# Patient Record
Sex: Female | Born: 1985 | Race: White | Hispanic: No | Marital: Married | Smoking: Current every day smoker
Health system: Southern US, Community
[De-identification: ages and names within clinical notes are randomized; demographics above are authoritative.]

## PROBLEM LIST (undated history)

## (undated) DIAGNOSIS — G950 Syringomyelia and syringobulbia: Secondary | ICD-10-CM

## (undated) DIAGNOSIS — B009 Herpesviral infection, unspecified: Secondary | ICD-10-CM

## (undated) DIAGNOSIS — Z8619 Personal history of other infectious and parasitic diseases: Secondary | ICD-10-CM

## (undated) DIAGNOSIS — R569 Unspecified convulsions: Secondary | ICD-10-CM

## (undated) HISTORY — DX: Herpesviral infection, unspecified: B00.9

## (undated) HISTORY — PX: WISDOM TOOTH EXTRACTION: SHX21

## (undated) HISTORY — DX: Personal history of other infectious and parasitic diseases: Z86.19

---
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
## 2015-12-30 DIAGNOSIS — G8929 Other chronic pain: Secondary | ICD-10-CM | POA: Insufficient documentation

## 2017-05-29 ENCOUNTER — Emergency Department
Admission: EM | Admit: 2017-05-29 | Discharge: 2017-05-30 | Disposition: A | Discharge status: Home / Self Care | Payer: 59 | Attending: Emergency Medicine | Admitting: Emergency Medicine

## 2017-05-29 ENCOUNTER — Encounter: Payer: Self-pay | Admitting: Emergency Medicine

## 2017-05-29 DIAGNOSIS — A0472 Enterocolitis due to Clostridium difficile, not specified as recurrent: Secondary | ICD-10-CM | POA: Diagnosis not present

## 2017-05-29 DIAGNOSIS — F17200 Nicotine dependence, unspecified, uncomplicated: Secondary | ICD-10-CM | POA: Insufficient documentation

## 2017-05-29 DIAGNOSIS — R197 Diarrhea, unspecified: Secondary | ICD-10-CM | POA: Diagnosis present

## 2017-05-29 HISTORY — DX: Unspecified convulsions: R56.9

## 2017-05-29 LAB — URINALYSIS, COMPLETE (UACMP) WITH MICROSCOPIC
Bilirubin Urine: NEGATIVE
GLUCOSE, UA: NEGATIVE mg/dL
Ketones, ur: 80 mg/dL — AB
Leukocytes, UA: NEGATIVE
Nitrite: NEGATIVE
PH: 5 (ref 5.0–8.0)
Protein, ur: NEGATIVE mg/dL
SPECIFIC GRAVITY, URINE: 1.011 (ref 1.005–1.030)
SQUAMOUS EPITHELIAL / LPF: NONE SEEN

## 2017-05-29 LAB — COMPREHENSIVE METABOLIC PANEL
ALK PHOS: 45 U/L (ref 38–126)
ALT: 13 U/L — AB (ref 14–54)
AST: 15 U/L (ref 15–41)
Albumin: 4.1 g/dL (ref 3.5–5.0)
Anion gap: 11 (ref 5–15)
BILIRUBIN TOTAL: 1.1 mg/dL (ref 0.3–1.2)
CALCIUM: 8.7 mg/dL — AB (ref 8.9–10.3)
CHLORIDE: 107 mmol/L (ref 101–111)
CO2: 18 mmol/L — ABNORMAL LOW (ref 22–32)
CREATININE: 0.73 mg/dL (ref 0.44–1.00)
GFR calc Af Amer: >60 mL/min (ref >60)
Glucose, Bld: 72 mg/dL (ref 65–99)
Potassium: 3.3 mmol/L — ABNORMAL LOW (ref 3.5–5.1)
Sodium: 136 mmol/L (ref 135–145)
Total Protein: 6.9 g/dL (ref 6.5–8.1)

## 2017-05-29 LAB — LIPASE, BLOOD: LIPASE: 29 U/L (ref 11–51)

## 2017-05-29 LAB — CBC
HCT: 43 % (ref 35.0–47.0)
Hemoglobin: 14.7 g/dL (ref 12.0–16.0)
MCH: 29.1 pg (ref 26.0–34.0)
MCHC: 34.1 g/dL (ref 32.0–36.0)
MCV: 85.3 fL (ref 80.0–100.0)
PLATELETS: 167 10*3/uL (ref 150–440)
RBC: 5.04 MIL/uL (ref 3.80–5.20)
RDW: 13.3 % (ref 11.5–14.5)
WBC: 7.5 10*3/uL (ref 3.6–11.0)

## 2017-05-29 LAB — POCT PREGNANCY, URINE: Preg Test, Ur: NEGATIVE

## 2017-05-29 MED ORDER — SODIUM CHLORIDE 0.9 % IV BOLUS (SEPSIS)
1000.0000 mL | Freq: Once | INTRAVENOUS | Status: AC
  Administered 2017-05-29: 1000 mL via INTRAVENOUS

## 2017-05-29 NOTE — ED Notes (Signed)
Pt reports diarrhea with cramps for 1 week, pt had ABX last week for wisdom teeth extraction,

## 2017-05-29 NOTE — ED Notes (Signed)
Family at bedside. 

## 2017-05-29 NOTE — ED Notes (Signed)
ED Provider at bedside. 

## 2017-05-29 NOTE — ED Provider Notes (Signed)
[facility] Emergency Department Provider Note  ____________________________________________   I have reviewed the triage vital signs and the nursing notes.   HISTORY  Chief Complaint Abdominal Pain; Emesis; and Diarrhea    HPI Hailey Pennington is a 31 y.o. female who presents today complaining of diarrhea. Patient's had diarrhea for 7 or 8 days. It happened while she was on a trip to [place]. Unfortunate, about a week for the patient was placed on vancomycin and amoxicillin for a dental infection. The dental infections completely resolved but the diarrhea has not been persistent for 1 week. She went to primary care, they did elect to order Cipro and Imodium for her however she elected not to take the Cipro she thought that might make the diarrhea worse if it was C. difficile. Patient has no history of C. difficile. She has had no melena or bright blood per rectum she's had occasional cramping but no significant abdominal pain sheet a fever week ago but none since, she is tolerating by mouth without difficulty and has not been vomiting. The diarrhea is watery and persistent she's had 5 episodes today. Nothing makes it better and nothing makes it worse, see above for associated symptoms and prior treatment    Past Medical History:  Diagnosis Date  . Seizures (HCC)     There are no active problems to display for this patient.   History reviewed. No pertinent surgical history.  Prior to Admission medications   Not on File    Allergies Patient has no known allergies.  No family history on file.  Social History Social History  Substance Use Topics  . Smoking status: Current Every Day Smoker  . Smokeless tobacco: Never Used  . Alcohol use Yes    Review of Systems Constitutional: No fever/chills Eyes: No visual changes. ENT: No sore throat. No stiff neck no neck pain Cardiovascular: Denies chest pain. Respiratory: Denies shortness of  breath. Gastrointestinal:   no vomiting.  positivediarrhea.  No constipation. Genitourinary: Negative for dysuria. Musculoskeletal: Negative lower extremity swelling Skin: Negative for rash. Neurological: Negative for severe headaches, focal weakness or numbness.   ____________________________________________   PHYSICAL EXAM:  VITAL SIGNS: ED Triage Vitals  Enc Vitals Group     BP 05/29/17 1631 127/73     Pulse Rate 05/29/17 1631 98     Resp 05/29/17 1631 16     Temp 05/29/17 1631 98.5 F (36.9 C)     Temp Source 05/29/17 1631 Oral     SpO2 05/29/17 1631 99 %     Weight 05/29/17 1631 124 lb (56.2 kg)     Height 05/29/17 1631  (1.549 m)     Head Circumference --      Peak Flow --      Pain Score 05/29/17 1629 3     Pain Loc --      Pain Edu? --      Excl. in GC? --     Constitutional: Alert and oriented. Well appearing and in no acute distress. Eyes: Conjunctivae are normal Head: Atraumatic HEENT: No congestion/rhinnorhea. Mucous membranes are somewhat dry.  Oropharynx non-erythematous Neck:   Nontender with no meningismus, no masses, no stridor Cardiovascular: Normal rate, regular rhythm. Grossly normal heart sounds.  Good peripheral circulation. Respiratory: Normal respiratory effort.  No retractions. Lungs CTAB. Abdominal: Soft and nontender. No distention. No guarding no rebound Back:  There is no focal tenderness or step off.  there is no midline tenderness there are no  lesions noted. there is no CVA tenderness Musculoskeletal: No lower extremity tenderness, no upper extremity tenderness. No joint effusions, no DVT signs strong distal pulses no edema Neurologic:  Normal speech and language. No gross focal neurologic deficits are appreciated.  Skin:  Skin is warm, dry and intact. No rash noted. Psychiatric: Mood and affect are normal. Speech and behavior are normal.  ____________________________________________   LABS (all labs ordered are listed, but only  abnormal results are displayed)  Labs Reviewed  COMPREHENSIVE METABOLIC PANEL - Abnormal; Notable for the following:       Result Value   Potassium 3.3 (*)    CO2 18 (*)    BUN <5 (*)    Calcium 8.7 (*)    ALT 13 (*)    All other components within normal limits  URINALYSIS, COMPLETE (UACMP) WITH MICROSCOPIC - Abnormal; Notable for the following:    Color, Urine YELLOW (*)    APPearance CLEAR (*)    Hgb urine dipstick LARGE (*)    Ketones, ur 80 (*)    Bacteria, UA RARE (*)    All other components within normal limits  GASTROINTESTINAL PANEL BY PCR, STOOL (REPLACES STOOL CULTURE)  C DIFFICILE QUICK SCREEN W PCR REFLEX  LIPASE, BLOOD  CBC  POCT PREGNANCY, URINE    Pertinent labs  results that were available during my care of the patient were reviewed by me and considered in my medical decision making (see chart for details). ____________________________________________  EKG  I personally interpreted any EKGs ordered by me or triage  ____________________________________________  RADIOLOGY  Pertinent labs & imaging results that were available during my care of the patient were reviewed by me and considered in my medical decision making (see chart for details). If possible, patient and/or family made aware of any abnormal findings. ____________________________________________    PROCEDURES  Procedure(s) performed: None  Procedures  Critical Care performed: None  ____________________________________________   INITIAL IMPRESSION / ASSESSMENT AND PLAN / ED COURSE  Pertinent labs & imaging results that were available during my care of the patient were reviewed by me and considered in my medical decision making (see chart for details).  patient here with diarrhea, 5 episodes today however has not had a diarrheal stool since she arrived. She's been on Imodium for h1 day. Recent significant antibiotic use increases the risk factor for eitherantibiotic associated diarrhea  orClostridium difficile we will send stool bio fire and C. difficile we will give her IV fluid her abdomen is benign, she is obviously somewhat dehydrated.    ____________________________________________   FINAL CLINICAL IMPRESSION(S) / ED DIAGNOSES  Final diagnoses:  None      This chart was dictated using voice recognition software.  Despite best efforts to proofread,  errors can occur which can change meaning.      Jeanmarie Plant, MD 05/29/17 2144

## 2017-05-29 NOTE — Discharge Instructions (Addendum)
PLease follow up with your primary care physician or the acute care clinic °

## 2017-05-29 NOTE — ED Triage Notes (Signed)
Pt to ED via POV stating that she has had abdominal pain, N/V/D since Sunday. Pt recently finished 2 courses of antibiotics. Pt was on Amoxicillin and the Vancomycin for 1 week after having a tooth pulled. Pt has been taking antidiarrhea medication without relief. Pt states that she is unable to keep fluids down. Pt states that she has seen blood in stool a few times.   Pts heart rate increases from 98 to 116 from sitting to standing.

## 2017-05-30 LAB — GASTROINTESTINAL PANEL BY PCR, STOOL (REPLACES STOOL CULTURE)
ADENOVIRUS F40/41: NOT DETECTED
Astrovirus: NOT DETECTED
CRYPTOSPORIDIUM: NOT DETECTED
CYCLOSPORA CAYETANENSIS: NOT DETECTED
Campylobacter species: NOT DETECTED
ENTEROAGGREGATIVE E COLI (EAEC): NOT DETECTED
ENTEROPATHOGENIC E COLI (EPEC): NOT DETECTED
Entamoeba histolytica: NOT DETECTED
Enterotoxigenic E coli (ETEC): NOT DETECTED
GIARDIA LAMBLIA: NOT DETECTED
Norovirus GI/GII: NOT DETECTED
PLESIMONAS SHIGELLOIDES: NOT DETECTED
Rotavirus A: NOT DETECTED
SALMONELLA SPECIES: NOT DETECTED
SHIGELLA/ENTEROINVASIVE E COLI (EIEC): NOT DETECTED
Sapovirus (I, II, IV, and V): NOT DETECTED
Shiga like toxin producing E coli (STEC): NOT DETECTED
VIBRIO SPECIES: NOT DETECTED
Vibrio cholerae: NOT DETECTED
YERSINIA ENTEROCOLITICA: NOT DETECTED

## 2017-05-30 LAB — C DIFFICILE QUICK SCREEN W PCR REFLEX
C Diff antigen: POSITIVE — AB
C Diff interpretation: DETECTED
C Diff toxin: POSITIVE — AB

## 2017-05-30 MED ORDER — METRONIDAZOLE 500 MG PO TABS: 500.0000 mg | ORAL_TABLET | Freq: Once | ORAL | Status: DC

## 2017-05-30 MED ORDER — METRONIDAZOLE 500 MG PO TABS: 500.0000 mg | ORAL_TABLET | Freq: Three times a day (TID) | ORAL | 0 refills | Status: AC

## 2017-05-30 MED ORDER — VANCOMYCIN 50 MG/ML ORAL SOLUTION
125.0000 mg | ORAL | Status: AC
  Administered 2017-05-30: 125 mg via ORAL
  Filled 2017-05-30: qty 2.5

## 2017-05-30 NOTE — ED Provider Notes (Signed)
-----------------------------------------   1:08 AM on 05/30/2017 -----------------------------------------   Blood pressure 101/71, pulse 83, temperature 98.5 F (36.9 C), temperature source Oral, resp. rate 18, height  (1.549 m), weight 56.2 kg (124 lb), last menstrual period 05/29/2017, SpO2 98 %.  Assuming care from Dr. Alphonzo Lemmings.  In short, Hailey Pennington is a 31 y.o. female with a chief complaint of Abdominal Pain; Emesis; and Diarrhea .  Refer to the original H&P for additional details.  The current plan of care is to follow up the results of the cdiff toxin.   The patient's C. difficile returned positive. I initially ordered the patient a dose of vancomycin orally. The patient's white blood cell count is normal and her creatinine is also normal. She did receive some fluids. I did have a conversation with the patient that I feel it is appropriate to discharge her to home. She is not vomiting and able to take by mouth. Since the patient had been taking vancomycin prior to this development of C. difficile I will discharge the patient on metronidazole. The patient will be discharged to follow-up with her primary care physician.       Rebecka Apley, MD 05/30/17 Lyda Jester

## 2018-06-03 DIAGNOSIS — M51369 Other intervertebral disc degeneration, lumbar region without mention of lumbar back pain or lower extremity pain: Secondary | ICD-10-CM | POA: Insufficient documentation

## 2018-06-03 DIAGNOSIS — M5416 Radiculopathy, lumbar region: Secondary | ICD-10-CM | POA: Insufficient documentation

## 2018-09-23 DIAGNOSIS — G950 Syringomyelia and syringobulbia: Secondary | ICD-10-CM | POA: Insufficient documentation

## 2019-01-02 DIAGNOSIS — D352 Benign neoplasm of pituitary gland: Secondary | ICD-10-CM | POA: Insufficient documentation

## 2019-01-30 ENCOUNTER — Ambulatory Visit: Payer: Managed Care, Other (non HMO) | Admitting: Adult Health

## 2019-04-12 DIAGNOSIS — G47411 Narcolepsy with cataplexy: Secondary | ICD-10-CM | POA: Insufficient documentation

## 2019-05-14 ENCOUNTER — Emergency Department
Admission: EM | Admit: 2019-05-14 | Discharge: 2019-05-14 | Disposition: A | Discharge status: Home / Self Care | Payer: Managed Care, Other (non HMO) | Attending: Emergency Medicine | Admitting: Emergency Medicine

## 2019-05-14 ENCOUNTER — Other Ambulatory Visit: Payer: Self-pay

## 2019-05-14 ENCOUNTER — Encounter: Payer: Self-pay | Admitting: Emergency Medicine

## 2019-05-14 DIAGNOSIS — H5711 Ocular pain, right eye: Secondary | ICD-10-CM | POA: Diagnosis present

## 2019-05-14 DIAGNOSIS — H1031 Unspecified acute conjunctivitis, right eye: Secondary | ICD-10-CM

## 2019-05-14 DIAGNOSIS — H10021 Other mucopurulent conjunctivitis, right eye: Secondary | ICD-10-CM | POA: Diagnosis not present

## 2019-05-14 DIAGNOSIS — F17210 Nicotine dependence, cigarettes, uncomplicated: Secondary | ICD-10-CM | POA: Insufficient documentation

## 2019-05-14 DIAGNOSIS — Z79899 Other long term (current) drug therapy: Secondary | ICD-10-CM | POA: Diagnosis not present

## 2019-05-14 HISTORY — DX: Syringomyelia and syringobulbia: G95.0

## 2019-05-14 MED ORDER — CIPROFLOXACIN HCL 0.3 % OP SOLN: 2.0000 [drp] | OPHTHALMIC | 0 refills | Status: AC

## 2019-05-14 MED ORDER — CIPROFLOXACIN HCL 0.3 % OP SOLN
2.0000 [drp] | OPHTHALMIC | Status: DC
  Administered 2019-05-14: 14:00:00 2 [drp] via OPHTHALMIC
  Filled 2019-05-14: qty 2.5

## 2019-05-14 NOTE — ED Notes (Signed)
Provider at bedside

## 2019-05-14 NOTE — ED Triage Notes (Signed)
Pt to ED via POV c/o right eye pain and redness x 1 day. Pt has photosensitivity. Pt is in NAD.

## 2019-05-14 NOTE — Discharge Instructions (Signed)
Please avoid wearing contacts for 1 week.  Use antibiotic eyedrops as prescribed, 2 drops to the right eye every 3 hours while awake for 7 days.

## 2019-05-14 NOTE — ED Notes (Signed)
First Nurse Note: Pt to ED for right eye pain and redness x 1 day

## 2019-05-14 NOTE — ED Provider Notes (Signed)
[facility] EMERGENCY DEPARTMENT Provider Note   CSN: [id number] Arrival date & time: 05/14/19  1337     History   Chief Complaint Chief Complaint  Patient presents with  . Eye Pain    HPI Marshawn Ninneman is a 33 y.o. female.  Presents emergency department evaluation of right eye pain and redness x1 day.  Pain is 1 out of 10.  She describes mild photosensitivity that began this morning.  Patient states she wears contacts, yesterday developed some irritation redness and drainage last night.  This morning noted more drainage with matting of the eyelids.  She denies any left eye pain.  No vision changes.  No fevers.  No trauma to the eye and no foreign body sensation.     HPI  Past Medical History:  Diagnosis Date  . Seizures ([place])   . 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   History reviewed. No pertinent surgical history.   OB History   No obstetric history on file.      Home Medications    Prior to Admission medications   Medication Sig Start Date End Date Taking? Authorizing Provider  ciprofloxacin (CILOXAN) 0.3 % ophthalmic solution Place 2 drops into both eyes every 3 (three) hours while awake for 7 days. Administer 1 drop, every 2 hours, while awake, for 2 days. Then 1 drop, every 4 hours, while awake, for the next 5 days. 05/14/19 05/21/19  Duanne Guess, PA-C  ciprofloxacin (CIPRO) 250 MG tablet  05/28/17   [provider]  diphenoxylate-atropine (LOMOTIL) 2.5-0.025 MG tablet Take 1 tablet by mouth 4 (four) times daily as needed.  05/28/17   [provider]  folic acid (FOLVITE) 1 MG tablet Take 2 tablets by mouth twice daily 05/18/16   [provider]  gabapentin (NEURONTIN) 300 MG capsule TK ONE C PO TID 09/27/18   [provider]  lamoTRIgine (LAMICTAL) 100 MG tablet Take by mouth.    [provider]  LamoTRIgine 300 MG TB24 Take 1 tablet by mouth  daily. 03/23/17   [provider]  medroxyPROGESTERone Acetate 150 MG/ML SUSY USE UTD 09/04/15   [provider]  methocarbamol (ROBAXIN) 500 MG tablet Take by mouth. 08/31/16   [provider]  Multiple Vitamin (MULTIVITAMIN) capsule Take by mouth.    [provider]  ondansetron (ZOFRAN-ODT) 4 MG disintegrating tablet Take 4 mg by mouth every 8 (eight) hours as needed.  05/28/17   [provider]    Family History No family history on file.  Social History Social History   Tobacco Use  . Smoking status: Current Every Day Smoker  . Smokeless tobacco: Never Used  Substance Use Topics  . Alcohol use: Yes  . Drug use: No     Allergies   Patient has no known allergies.   Review of Systems Review of Systems  Constitutional: Negative for fever.  Eyes: Positive for photophobia, pain (1/10), discharge and redness. Negative for itching and visual disturbance.  Skin: Negative for rash.  Neurological: Negative for headaches.     Physical Exam Updated Vital Signs Ht 5\' 1"  (1.549 m)   Wt 49 kg   BMI 20.41 kg/m   Physical Exam Constitutional:      Appearance: She is well-developed.  HENT:     Head: Normocephalic and atraumatic.  Eyes:     General: Lids are normal. Vision grossly intact. Gaze aligned appropriately.  Right eye: Discharge present.     Extraocular Movements: Extraocular movements intact.     Right eye: Normal extraocular motion.     Conjunctiva/sclera:     Right eye: Right conjunctiva is injected. No exudate or hemorrhage.    Pupils: Pupils are equal, round, and reactive to light.  Neck:     Musculoskeletal: Normal range of motion.  Cardiovascular:     Rate and Rhythm: Normal rate.  Pulmonary:     Effort: Pulmonary effort is normal. No respiratory distress.  Musculoskeletal: Normal range of motion.  Skin:    General: Skin is warm.     Findings: No rash.  Neurological:     Mental Status: She is alert and  oriented to person, place, and time.  Psychiatric:        Behavior: Behavior normal.        Thought Content: Thought content normal.      ED Treatments / Results  Labs (all labs ordered are listed, but only abnormal results are displayed) Labs Reviewed - No data to display  EKG None  Radiology No results found.  Procedures Procedures (including critical care time)  Medications Ordered in ED Medications  ciprofloxacin (CILOXAN) 0.3 % ophthalmic solution 2 drop (2 drops Right Eye Given 05/14/19 1419)     Initial Impression / Assessment and Plan / ED Course  I have reviewed the triage vital signs and the nursing notes.  Pertinent labs & imaging results that were available during my care of the patient were reviewed by me and considered in my medical decision making (see chart for details).        33 year old female with conjunctivitis.  She is a contact lens wear.  She is started on Cipro ophthalmic eyedrops.  She will avoid contacts for 7 days.  She will follow-up with ophthalmology if no improvement in the next 2 to 3 days.  She return to the ER for any loss of vision, increasing pain fevers worsening symptoms or urgent changes in health.  Final Clinical Impressions(s) / ED Diagnoses   Final diagnoses:  Acute bacterial conjunctivitis of right eye    ED Discharge Orders         Ordered    ciprofloxacin (CILOXAN) 0.3 % ophthalmic solution  Every  3 hours while awake     05/14/19 1429           Ronnette Juniper 05/14/19 1434    Concha Se, MD 05/15/19 [phone]

## 2019-08-11 DIAGNOSIS — B009 Herpesviral infection, unspecified: Secondary | ICD-10-CM | POA: Insufficient documentation

## 2019-09-20 DIAGNOSIS — N879 Dysplasia of cervix uteri, unspecified: Secondary | ICD-10-CM | POA: Insufficient documentation

## 2019-11-15 DIAGNOSIS — F17210 Nicotine dependence, cigarettes, uncomplicated: Secondary | ICD-10-CM | POA: Insufficient documentation

## 2020-01-06 DIAGNOSIS — Z6791 Unspecified blood type, Rh negative: Secondary | ICD-10-CM | POA: Insufficient documentation

## 2020-03-22 ENCOUNTER — Emergency Department: Payer: Managed Care, Other (non HMO)

## 2020-03-22 ENCOUNTER — Other Ambulatory Visit: Payer: Self-pay

## 2020-03-22 ENCOUNTER — Emergency Department
Admission: EM | Admit: 2020-03-22 | Discharge: 2020-03-23 | Disposition: A | Discharge status: Home / Self Care | Payer: Managed Care, Other (non HMO) | Attending: Emergency Medicine | Admitting: Emergency Medicine

## 2020-03-22 DIAGNOSIS — R0602 Shortness of breath: Secondary | ICD-10-CM | POA: Diagnosis not present

## 2020-03-22 DIAGNOSIS — R05 Cough: Secondary | ICD-10-CM | POA: Insufficient documentation

## 2020-03-22 DIAGNOSIS — Z5321 Procedure and treatment not carried out due to patient leaving prior to being seen by health care provider: Secondary | ICD-10-CM | POA: Diagnosis not present

## 2020-03-22 DIAGNOSIS — R079 Chest pain, unspecified: Secondary | ICD-10-CM | POA: Insufficient documentation

## 2020-03-22 LAB — CBC
HCT: 28.3 % — ABNORMAL LOW (ref 36.0–46.0)
Hemoglobin: 9.3 g/dL — ABNORMAL LOW (ref 12.0–15.0)
MCH: 29.6 pg (ref 26.0–34.0)
MCHC: 32.9 g/dL (ref 30.0–36.0)
MCV: 90.1 fL (ref 80.0–100.0)
Platelets: 253 10*3/uL (ref 150–400)
RBC: 3.14 MIL/uL — ABNORMAL LOW (ref 3.87–5.11)
RDW: 14.3 % (ref 11.5–15.5)
WBC: 8.5 10*3/uL (ref 4.0–10.5)
nRBC: 0 % (ref 0.0–0.2)

## 2020-03-22 MED ORDER — SODIUM CHLORIDE 0.9% FLUSH: 3.0000 mL | Freq: Once | INTRAVENOUS | Status: DC

## 2020-03-22 NOTE — ED Triage Notes (Signed)
PT to ED via POV from home. Had c-section Aug. 3, and has been having SOB and CP since. PT describes it as if someone put suction down your throat and took your breath away, generalized sharp CP. PT does smoke. Occasional cough.

## 2020-03-23 ENCOUNTER — Emergency Department: Payer: Managed Care, Other (non HMO)

## 2020-03-23 ENCOUNTER — Encounter: Payer: Self-pay | Admitting: Radiology

## 2020-03-23 LAB — BASIC METABOLIC PANEL
Anion gap: 11 (ref 5–15)
BUN: 23 mg/dL — ABNORMAL HIGH (ref 6–20)
CO2: 24 mmol/L (ref 22–32)
Calcium: 8.9 mg/dL (ref 8.9–10.3)
Chloride: 106 mmol/L (ref 98–111)
Creatinine, Ser: 0.8 mg/dL (ref 0.44–1.00)
GFR calc Af Amer: >60 mL/min (ref >60)
GFR calc non Af Amer: >60 mL/min (ref >60)
Glucose, Bld: 100 mg/dL — ABNORMAL HIGH (ref 70–99)
Potassium: 3.8 mmol/L (ref 3.5–5.1)
Sodium: 141 mmol/L (ref 135–145)

## 2020-03-23 LAB — TROPONIN I (HIGH SENSITIVITY): Troponin I (High Sensitivity): 10 ng/L (ref <18)

## 2020-03-23 MED ORDER — SODIUM CHLORIDE 0.9 % IV BOLUS: 1000.0000 mL | Freq: Once | INTRAVENOUS | Status: DC

## 2020-03-23 MED ORDER — IOHEXOL 350 MG/ML SOLN
75.0000 mL | Freq: Once | INTRAVENOUS | Status: AC | PRN
  Administered 2020-03-23: 75 mL via INTRAVENOUS

## 2020-06-05 ENCOUNTER — Encounter: Payer: Self-pay | Admitting: Obstetrics and Gynecology

## 2020-06-05 ENCOUNTER — Other Ambulatory Visit: Payer: Self-pay

## 2020-06-05 ENCOUNTER — Ambulatory Visit (INDEPENDENT_AMBULATORY_CARE_PROVIDER_SITE_OTHER): Payer: Managed Care, Other (non HMO) | Admitting: Obstetrics and Gynecology

## 2020-06-05 VITALS — BP 120/70 | Ht 61.0 in | Wt 127.0 lb

## 2020-06-05 DIAGNOSIS — Z3042 Encounter for surveillance of injectable contraceptive: Secondary | ICD-10-CM

## 2020-06-05 MED ORDER — MEDROXYPROGESTERONE ACETATE 150 MG/ML IM SUSY: 150.0000 mg | PREFILLED_SYRINGE | Freq: Once | INTRAMUSCULAR | 2 refills | Status: DC

## 2020-06-05 NOTE — Patient Instructions (Signed)
I value your feedback and entrusting us with your care. If you get a [facility] patient survey, I would appreciate you taking the time to let us know about your experience today. Thank you!  As of July 27, 2019, your lab results will be released to your MyChart immediately, before I even have a chance to see them. Please give me time to review them and contact you if there are any abnormalities. Thank you for your patience.  

## 2020-06-05 NOTE — Progress Notes (Signed)
Patient, No Pcp Per   Chief Complaint  Patient presents with  . Establish Care    HPI:      [name] is a 34 y.o. No obstetric history on file. whose LMP was No LMP recorded. Patient has had an injection., presents today for NP establish care.  S/p C/S 03/19/20; went to [facility]. Nexplanon inserted PP; removed 05/28/20 at [facility] due to insomnia and bleeding. Pt started depo, first injection given 05/28/20. Bleeding stopped next day, doing well so far. Needs Rx and wants to start coming here for her annuals/GYN care.  Last pap 05/03/20 was neg. S/p ASCUS/pos HPV DNA 2020 with neg colpo. Repeat pap WNL.  Hx of genital herpes, takes valtrex episodically. Was taking daily during pregnancy. Has Rx.  FH cx vs uterine vs ovar cancer on mat side. Pt to clarify FH. Also with MGF with pancreatic cancer. Can discuss MyRisk testing at annual once FH clarified.  Past Medical History:  Diagnosis Date  . Herpes   . History of HPV infection   . Seizures (HCC)   . Syringomyelia (HCC)     History reviewed. No pertinent surgical history.  Family History  Problem Relation Age of Onset  . Testicular cancer Maternal Uncle   . Ovarian cancer Maternal Grandmother        not sure of age  . Pancreatic cancer Maternal Grandfather        87  . Colon cancer Paternal Grandfather   . Cancer Paternal Grandfather        eye    Social History   Socioeconomic History  . Marital status: Married    Spouse name: Not on file  . Number of children: Not on file  . Years of education: Not on file  . Highest education level: Not on file  Occupational History  . Not on file  Tobacco Use  . Smoking status: Current Every Day Smoker  . Smokeless tobacco: Never Used  Vaping Use  . Vaping Use: Never used  Substance and Sexual Activity  . Alcohol use: Yes  . Drug use: No  . Sexual activity: Yes    Birth control/protection: Injection  Other Topics Concern  . Not on file  Social History Narrative  .  Not on file   Social Determinants of Health   Financial Resource Strain:   . Difficulty of Paying Living Expenses: Not on file  Food Insecurity:   . Worried About Programme researcher, broadcasting/film/video in the Last Year: Not on file  . Ran Out of Food in the Last Year: Not on file  Transportation Needs:   . Lack of Transportation (Medical): Not on file  . Lack of Transportation (Non-Medical): Not on file  Physical Activity:   . Days of Exercise per Week: Not on file  . Minutes of Exercise per Session: Not on file  Stress:   . Feeling of Stress : Not on file  Social Connections:   . Frequency of Communication with Friends and Family: Not on file  . Frequency of Social Gatherings with Friends and Family: Not on file  . Attends Religious Services: Not on file  . Active Member of Clubs or Organizations: Not on file  . Attends Banker Meetings: Not on file  . Marital Status: Not on file  Intimate Partner Violence:   . Fear of Current or Ex-Partner: Not on file  . Emotionally Abused: Not on file  . Physically Abused: Not on file  .  Sexually Abused: Not on file    Outpatient Medications Prior to Visit  Medication Sig Dispense Refill  . DEPO-PROVERA 150 MG/ML injection SMARTSIG:Injection IM Every 3 Months    . folic acid (FOLVITE) 1 MG tablet Take 2 tablets by mouth twice daily    . LamoTRIgine 300 MG TB24 24 hour tablet Take by mouth.    . meloxicam (MOBIC) 7.5 MG tablet Take 7.5 mg by mouth daily as needed.    . methocarbamol (ROBAXIN) 500 MG tablet Take by mouth.    . Multiple Vitamin (MULTIVITAMIN) capsule Take by mouth.    . Prenatal 28-0.8 MG TABS Take 1 tablet by mouth daily.    Marland Kitchen triamcinolone ointment (KENALOG) 0.1 % Apply topically.    . ciprofloxacin (CIPRO) 250 MG tablet     . diphenoxylate-atropine (LOMOTIL) 2.5-0.025 MG tablet Take 1 tablet by mouth 4 (four) times daily as needed.     . gabapentin (NEURONTIN) 300 MG capsule TK ONE C PO TID    . lamoTRIgine (LAMICTAL) 100 MG  tablet Take by mouth.    . LamoTRIgine 300 MG TB24 Take 1 tablet by mouth daily.  11  . medroxyPROGESTERone Acetate 150 MG/ML SUSY USE UTD    . ondansetron (ZOFRAN-ODT) 4 MG disintegrating tablet Take 4 mg by mouth every 8 (eight) hours as needed.      No facility-administered medications prior to visit.      ROS:  Review of Systems  Constitutional: Negative for fever.  Gastrointestinal: Negative for blood in stool, constipation, diarrhea, nausea and vomiting.  Genitourinary: Negative for dyspareunia, dysuria, flank pain, frequency, hematuria, urgency, vaginal bleeding, vaginal discharge and vaginal pain.  Musculoskeletal: Negative for back pain.  Skin: Negative for rash.  BREAST: No symptoms   OBJECTIVE:   Vitals:  BP 120/70   Ht 5\' 1"  (1.549 m)   Wt 127 lb (57.6 kg)   Breastfeeding No   BMI 24.00 kg/m   Physical Exam Vitals reviewed.  Constitutional:      Appearance: She is well-developed.  Pulmonary:     Effort: Pulmonary effort is normal.  Musculoskeletal:        General: Normal range of motion.     Cervical back: Normal range of motion.  Skin:    General: Skin is warm and dry.  Neurological:     General: No focal deficit present.     Mental Status: She is alert and oriented to person, place, and time.     Cranial Nerves: No cranial nerve deficit.  Psychiatric:        Mood and Affect: Mood normal.        Behavior: Behavior normal.        Thought Content: Thought content normal.        Judgment: Judgment normal.     Assessment/Plan: Encounter for surveillance of injectable contraceptive - Plan: medroxyPROGESTERone Acetate 150 MG/ML SUSY; Rx RF. F/u at 9/22 annual/sooner prn. RTO Q3 months for inj    Meds ordered this encounter  Medications  . medroxyPROGESTERone Acetate 150 MG/ML SUSY    Sig: Inject 1 mL (150 mg total) into the muscle once for 1 dose.    Dispense:  1 mL    Refill:  2    Order Specific Question:   Supervising Provider    Answer:    10/22 Nadara Mustard      Return in about 1 year (around 06/05/2021).  Mayleen Borrero B. Ankita Newcomer, PA-C 06/05/2020 4:31 PM

## 2020-08-19 ENCOUNTER — Ambulatory Visit: Payer: Managed Care, Other (non HMO)

## 2020-08-20 ENCOUNTER — Ambulatory Visit (INDEPENDENT_AMBULATORY_CARE_PROVIDER_SITE_OTHER): Payer: Managed Care, Other (non HMO)

## 2020-08-20 ENCOUNTER — Other Ambulatory Visit: Payer: Self-pay

## 2020-08-20 DIAGNOSIS — Z3042 Encounter for surveillance of injectable contraceptive: Secondary | ICD-10-CM | POA: Diagnosis not present

## 2020-08-20 MED ORDER — MEDROXYPROGESTERONE ACETATE 150 MG/ML IM SUSP
150.0000 mg | Freq: Once | INTRAMUSCULAR | Status: AC
  Administered 2020-08-20: 150 mg via INTRAMUSCULAR

## 2020-08-20 NOTE — Progress Notes (Signed)
Patient presents today for Depo Provera injection within dates. Given IM Right Deltoid per patient request. Patient tolerated well. 

## 2020-11-12 ENCOUNTER — Other Ambulatory Visit: Payer: Self-pay

## 2020-11-12 ENCOUNTER — Ambulatory Visit (INDEPENDENT_AMBULATORY_CARE_PROVIDER_SITE_OTHER): Payer: Managed Care, Other (non HMO)

## 2020-11-12 DIAGNOSIS — Z3042 Encounter for surveillance of injectable contraceptive: Secondary | ICD-10-CM | POA: Diagnosis not present

## 2020-11-12 MED ORDER — MEDROXYPROGESTERONE ACETATE 150 MG/ML IM SUSP
150.0000 mg | Freq: Once | INTRAMUSCULAR | Status: AC
  Administered 2020-11-12: 150 mg via INTRAMUSCULAR

## 2020-11-12 NOTE — Progress Notes (Signed)
Patient presents today for Depo Provera injection within dates. Given IM LD. Patient tolerated well.  

## 2021-02-04 ENCOUNTER — Ambulatory Visit: Payer: Managed Care, Other (non HMO)

## 2021-02-05 ENCOUNTER — Telehealth: Payer: Self-pay

## 2021-02-05 ENCOUNTER — Ambulatory Visit: Payer: Managed Care, Other (non HMO)

## 2021-02-05 NOTE — Telephone Encounter (Signed)
LMTC

## 2021-02-05 NOTE — Telephone Encounter (Signed)
Yes

## 2021-02-05 NOTE — Telephone Encounter (Signed)
Pt aware okay for neighbor to give depo.

## 2021-02-05 NOTE — Telephone Encounter (Signed)
Pt calling to see if it would be okay for her neighbor who is a nurse give her the depo instead of her coming all the way here with her son.  [phone]

## 2021-02-07 ENCOUNTER — Ambulatory Visit: Payer: Managed Care, Other (non HMO)

## 2021-08-27 ENCOUNTER — Other Ambulatory Visit: Payer: Self-pay | Admitting: Neurosurgery

## 2021-08-27 DIAGNOSIS — G950 Syringomyelia and syringobulbia: Secondary | ICD-10-CM

## 2021-09-08 ENCOUNTER — Ambulatory Visit: Payer: Managed Care, Other (non HMO)

## 2021-10-01 ENCOUNTER — Telehealth: Payer: Self-pay

## 2021-10-01 ENCOUNTER — Other Ambulatory Visit: Payer: Self-pay | Admitting: Obstetrics and Gynecology

## 2021-10-01 MED ORDER — FOLIC ACID 1 MG PO TABS: 2.0000 mg | ORAL_TABLET | Freq: Two times a day (BID) | ORAL | 11 refills | Status: DC

## 2021-10-01 NOTE — Telephone Encounter (Signed)
Rx eRxd.  

## 2021-10-01 NOTE — Progress Notes (Signed)
Rx folic acid for pre-conception. On lamotrigine and needs higher dose.

## 2021-10-01 NOTE — Telephone Encounter (Signed)
Patient has scheduled apt for 10/07/2020. Spoke w/patient to inquire how long she has been off the Folic Acid. She stopped it about 1 year ago. She is wanting to start trying to conceive again. Advised will send to [facility] for review, may defer til apt.

## 2021-10-01 NOTE — Telephone Encounter (Signed)
Patient requesting rx for Folic Acid. States she was on it before. She takes Lamotrigine so it's a higher dose. She took 1mg  2x a day. She is aware she may need an appointment. She is calling to schedule. [phone].

## 2021-10-02 NOTE — Telephone Encounter (Signed)
Patient aware. She will keep apt scheduled w/Alicia. Requested it be changed to Annual exam so it is coded correctly. Advised will send to scheduler to update apt type.

## 2021-10-03 NOTE — Telephone Encounter (Signed)
Appointment type corrected/ change to annual per pt request

## 2021-10-06 NOTE — Progress Notes (Signed)
PCP:  Patient, No Pcp Per (Inactive)   Chief Complaint  Patient presents with   Gynecologic Exam    No concerns     HPI:      [name] is a 36 y.o. G1P1001 whose LMP was Patient's last menstrual period was 09/15/2021 (approximate)., presents today for her annual examination.  Her menses are regular every 28-30 days, lasting 5-7 days.  Dysmenorrhea mild, occurring first 1-2 days of flow. She does not have intermenstrual bleeding usually but did LMP.  Sex activity: single partner, contraception - none. Would like to conceive. Given Rx folic acid due to lamictal use; pt plans to start this wk. 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 Delivered at [facility] with MFM last pregnancy Last Pap: 05/03/20 Results were: no abnormalities /neg HPV DNA at [facility]. Declines pap today; hx of abn paps in past. Hx of STDs: HSV, takes valtrex; ext HPV lesions in past  There is no FH of breast cancer. There is no FH of ovarian cancer. The patient does not do self-breast exams.  Tobacco use: 1/2 ppd, will quit with pregnancy Alcohol use: none No drug use.  Exercise: moderately active  She does get adequate calcium but not Vitamin D in her diet.  Past Medical History:  Diagnosis Date   Herpes    History of HPV infection    Seizures ([place])    Syringomyelia (H[facility])      Past Surgical History:  Procedure Laterality Date   CESAREAN SECTION     WISDOM TOOTH EXTRACTION      Family History  Problem Relation Age of Onset   Other Mother        precancerous cells in uterine lining   Endometriosis Mother    Testicular cancer Maternal Uncle    Endometriosis Maternal Grandmother    Uterine cancer Maternal Grandmother        late years   Pancreatic cancer Maternal Grandfather        15   Colon cancer Paternal Grandfather    Cancer Paternal Grandfather        eye    Social History   Socioeconomic History   Marital status: Married    Spouse name: Not on file   Number of children:  Not on file   Years of education: Not on file   Highest education level: Not on file  Occupational History   Not on file  Tobacco Use   Smoking status: Every Day   Smokeless tobacco: Never  Vaping Use   Vaping Use: Never used  Substance and Sexual Activity   Alcohol use: Yes   Drug use: No   Sexual activity: Yes    Birth control/protection: None  Other Topics Concern   Not on file  Social History Narrative   Not on file   Social Determinants of Health   Financial Resource Strain: Not on file  Food Insecurity: Not on file  Transportation Needs: Not on file  Physical Activity: Not on file  Stress: Not on file  Social Connections: Not on file  Intimate Partner Violence: Not on file     Current Outpatient Medications:    cyclobenzaprine (FLEXERIL) 10 MG tablet, Take 10 mg by mouth 3 (three) times daily., Disp: , Rfl:    LamoTRIgine 300 MG TB24 24 hour tablet, Take by mouth., Disp: , Rfl:    meloxicam (MOBIC) 7.5 MG tablet, Take 7.5 mg by mouth daily as needed., Disp: , Rfl:  nicotine (NICODERM CQ - DOSED IN MG/24 HOURS) 14 mg/24hr patch, 1 patch to skin Transdermal Once a day for 14 days, Disp: , Rfl:    valACYclovir (VALTREX) 500 MG tablet, Take 500 mg by mouth 2 (two) times daily., Disp: , Rfl:    folic acid (FOLVITE) 1 MG tablet, Take 2 tablets (2 mg total) by mouth 2 (two) times daily. (Patient not taking: Reported on 10/07/2021), Disp: 60 tablet, Rfl: 11     ROS:  Review of Systems  Constitutional:  Negative for fatigue, fever and unexpected weight change.  Respiratory:  Negative for cough, shortness of breath and wheezing.   Cardiovascular:  Negative for chest pain, palpitations and leg swelling.  Gastrointestinal:  Negative for blood in stool, constipation, diarrhea, nausea and vomiting.  Endocrine: Negative for cold intolerance, heat intolerance and polyuria.  Genitourinary:  Negative for dyspareunia, dysuria, flank pain, frequency, genital sores, hematuria,  menstrual problem, pelvic pain, urgency, vaginal bleeding, vaginal discharge and vaginal pain.  Musculoskeletal:  Negative for back pain, joint swelling and myalgias.  Skin:  Negative for rash.  Neurological:  Negative for dizziness, syncope, light-headedness, numbness and headaches.  Hematological:  Negative for adenopathy.  Psychiatric/Behavioral:  Negative for agitation, confusion, sleep disturbance and suicidal ideas. The patient is not nervous/anxious.   BREAST: No symptoms   Objective: BP 110/70    Ht 5\' 1"  (1.549 m)    Wt 124 lb (56.2 kg)    LMP 09/15/2021 (Approximate)    BMI 23.43 kg/m    Physical Exam Constitutional:      Appearance: She is well-developed.  Genitourinary:     Vulva normal.     Right Labia: No rash, tenderness or lesions.    Left Labia: No tenderness, lesions or rash.    No vaginal discharge, erythema or tenderness.      Right Adnexa: not tender and no mass present.    Left Adnexa: not tender and no mass present.    No cervical friability or polyp.     Uterus is not enlarged or tender.  Breasts:    Right: No mass, nipple discharge, skin change or tenderness.     Left: No mass, nipple discharge, skin change or tenderness.  Neck:     Thyroid: No thyromegaly.  Cardiovascular:     Rate and Rhythm: Normal rate and regular rhythm.     Heart sounds: Normal heart sounds. No murmur heard. Pulmonary:     Effort: Pulmonary effort is normal.     Breath sounds: Normal breath sounds.  Abdominal:     Palpations: Abdomen is soft.     Tenderness: There is no abdominal tenderness. There is no guarding or rebound.  Musculoskeletal:        General: Normal range of motion.     Cervical back: Normal range of motion.  Lymphadenopathy:     Cervical: No cervical adenopathy.  Neurological:     General: No focal deficit present.     Mental Status: She is alert and oriented to person, place, and time.     Cranial Nerves: No cranial nerve deficit.  Skin:    General:  Skin is warm and dry.  Psychiatric:        Mood and Affect: Mood normal.        Behavior: Behavior normal.        Thought Content: Thought content normal.        Judgment: Judgment normal.  Vitals reviewed.    Assessment/Plan: Encounter for annual routine  gynecological examination  Pre-conception counseling--start folic acid Rx, f/u prn NOB. Will need to then be referred to [facility] MFM.             GYN counsel adequate intake of calcium and vitamin D, diet and exercise, tob cessation     F/U  Return in about 1 year (around 10/07/2022).  Jaquarius Seder B. Krimson Massmann, PA-C 10/07/2021 11:35 AM

## 2021-10-07 ENCOUNTER — Ambulatory Visit (INDEPENDENT_AMBULATORY_CARE_PROVIDER_SITE_OTHER): Payer: Managed Care, Other (non HMO) | Admitting: Obstetrics and Gynecology

## 2021-10-07 ENCOUNTER — Encounter: Payer: Self-pay | Admitting: Obstetrics and Gynecology

## 2021-10-07 ENCOUNTER — Other Ambulatory Visit: Payer: Self-pay

## 2021-10-07 VITALS — BP 110/70 | Ht 61.0 in | Wt 124.0 lb

## 2021-10-07 DIAGNOSIS — Z1151 Encounter for screening for human papillomavirus (HPV): Secondary | ICD-10-CM

## 2021-10-07 DIAGNOSIS — Z01419 Encounter for gynecological examination (general) (routine) without abnormal findings: Secondary | ICD-10-CM | POA: Diagnosis not present

## 2021-10-07 DIAGNOSIS — Z124 Encounter for screening for malignant neoplasm of cervix: Secondary | ICD-10-CM

## 2021-10-07 DIAGNOSIS — Z3169 Encounter for other general counseling and advice on procreation: Secondary | ICD-10-CM

## 2021-10-07 NOTE — Patient Instructions (Signed)
I value your feedback and you entrusting us with your care. If you get a [facility] patient survey, I would appreciate you taking the time to let us know about your experience today. Thank you! ? ? ?

## 2021-10-21 ENCOUNTER — Emergency Department
Admission: EM | Admit: 2021-10-21 | Discharge: 2021-10-21 | Disposition: A | Discharge status: Home / Self Care | Payer: Managed Care, Other (non HMO) | Attending: Emergency Medicine | Admitting: Emergency Medicine

## 2021-10-21 ENCOUNTER — Encounter: Payer: Self-pay | Admitting: Emergency Medicine

## 2021-10-21 ENCOUNTER — Other Ambulatory Visit: Payer: Self-pay

## 2021-10-21 DIAGNOSIS — R102 Pelvic and perineal pain: Secondary | ICD-10-CM | POA: Insufficient documentation

## 2021-10-21 DIAGNOSIS — R42 Dizziness and giddiness: Secondary | ICD-10-CM | POA: Diagnosis not present

## 2021-10-21 DIAGNOSIS — R3915 Urgency of urination: Secondary | ICD-10-CM | POA: Insufficient documentation

## 2021-10-21 DIAGNOSIS — R1030 Lower abdominal pain, unspecified: Secondary | ICD-10-CM | POA: Diagnosis present

## 2021-10-21 LAB — CHLAMYDIA/NGC RT PCR (ARMC ONLY)
Chlamydia Tr: NOT DETECTED
N gonorrhoeae: NOT DETECTED

## 2021-10-21 LAB — URINALYSIS, ROUTINE W REFLEX MICROSCOPIC
Bilirubin Urine: NEGATIVE
Glucose, UA: NEGATIVE mg/dL
Ketones, ur: NEGATIVE mg/dL
Nitrite: NEGATIVE
Protein, ur: NEGATIVE mg/dL
Specific Gravity, Urine: 1.016 (ref 1.005–1.030)
pH: 6 (ref 5.0–8.0)

## 2021-10-21 LAB — WET PREP, GENITAL
Clue Cells Wet Prep HPF POC: NONE SEEN
Sperm: NONE SEEN
Trich, Wet Prep: NONE SEEN
WBC, Wet Prep HPF POC: <10 (ref <10)
Yeast Wet Prep HPF POC: NONE SEEN

## 2021-10-21 LAB — COMPREHENSIVE METABOLIC PANEL
ALT: 13 U/L (ref 0–44)
AST: 16 U/L (ref 15–41)
Albumin: 5.2 g/dL — ABNORMAL HIGH (ref 3.5–5.0)
Alkaline Phosphatase: 81 U/L (ref 38–126)
Anion gap: 11 (ref 5–15)
BUN: 15 mg/dL (ref 6–20)
CO2: 28 mmol/L (ref 22–32)
Calcium: 10 mg/dL (ref 8.9–10.3)
Chloride: 99 mmol/L (ref 98–111)
Creatinine, Ser: 0.62 mg/dL (ref 0.44–1.00)
GFR, Estimated: >60 mL/min (ref >60)
Glucose, Bld: 110 mg/dL — ABNORMAL HIGH (ref 70–99)
Potassium: 3.6 mmol/L (ref 3.5–5.1)
Sodium: 138 mmol/L (ref 135–145)
Total Bilirubin: 1.2 mg/dL (ref 0.3–1.2)
Total Protein: 8.8 g/dL — ABNORMAL HIGH (ref 6.5–8.1)

## 2021-10-21 LAB — CBC
HCT: 49.5 % — ABNORMAL HIGH (ref 36.0–46.0)
Hemoglobin: 16.3 g/dL — ABNORMAL HIGH (ref 12.0–15.0)
MCH: 28.5 pg (ref 26.0–34.0)
MCHC: 32.9 g/dL (ref 30.0–36.0)
MCV: 86.5 fL (ref 80.0–100.0)
Platelets: 250 10*3/uL (ref 150–400)
RBC: 5.72 MIL/uL — ABNORMAL HIGH (ref 3.87–5.11)
RDW: 13.1 % (ref 11.5–15.5)
WBC: 6.6 10*3/uL (ref 4.0–10.5)
nRBC: 0 % (ref 0.0–0.2)

## 2021-10-21 LAB — HCG, QUANTITATIVE, PREGNANCY: hCG, Beta Chain, Quant, S: 1 m[IU]/mL (ref <5)

## 2021-10-21 LAB — LIPASE, BLOOD: Lipase: 39 U/L (ref 11–51)

## 2021-10-21 MED ORDER — SODIUM CHLORIDE 0.9 % IV BOLUS: 1000.0000 mL | Freq: Once | INTRAVENOUS | Status: DC

## 2021-10-21 MED ORDER — ACETAMINOPHEN 325 MG PO TABS
650.0000 mg | ORAL_TABLET | Freq: Once | ORAL | Status: AC
  Administered 2021-10-21: 650 mg via ORAL
  Filled 2021-10-21: qty 2

## 2021-10-21 MED ORDER — NITROFURANTOIN MONOHYD MACRO 100 MG PO CAPS: 100.0000 mg | ORAL_CAPSULE | Freq: Two times a day (BID) | ORAL | 0 refills | Status: AC

## 2021-10-21 MED ORDER — MICONAZOLE NITRATE 200 MG VA SUPP
200.0000 mg | Freq: Every day | VAGINAL | 0 refills | Status: DC
Start: 2021-10-21 — End: 2023-06-18

## 2021-10-21 NOTE — ED Provider Notes (Signed)
? ?[facility] ?Provider Note ? ? ? Event Date/Time  ? First MD Initiated Contact with Patient 10/21/21 1421   ?  (approximate) ? ? ?History  ? ?Pelvic pain ? ?HPI ? ?Hailey Pennington is a 36 y.o. female with significant past medical history who comes ED complaining of an episode of lightheadedness with heart rate about 140 that happened earlier today in the setting of lower abdominal discomfort for the past 2 hours. ? ?Patient reports she was in her usual state of health, until about 2 weeks ago when she had a well woman exam at her usual primary care doctor.  They did a brief speculum exam at that time.  She then had her menstrual cycle a few days later which is now resolved after a normal course.  However, over the past week since then she has had a gradual onset and worsening constant feeling of vaginal swelling or possible foreign body.  Denies any unusual bleeding or vaginal discharge.  She does report some urinary urgency.  No fever or chills, no chest pain shortness of breath or vomiting.  No constipation ?  ? ? ?Physical Exam  ? ?Triage Vital Signs: ?ED Triage Vitals  ?Enc Vitals Group  ?   BP 10/21/21 1355 (!) 134/92  ?   Pulse Rate 10/21/21 1355 (!) 138  ?   Resp 10/21/21 1430 (!) 9  ?   Temp 10/21/21 1355 98.2 ?F (36.8 ?C)  ?   Temp Source 10/21/21 1355 Oral  ?   SpO2 10/21/21 1355 99 %  ?   Weight 10/21/21 1354 123 lb 14.4 oz (56.2 kg)  ?   Height 10/21/21 1354 5\' 1"  (1.549 m)  ?   Head Circumference --   ?   Peak Flow --   ?   Pain Score --   ?   Pain Loc --   ?   Pain Edu? --   ?   Excl. in [place]? --   ? ? ?Most recent vital signs: ?Vitals:  ? 10/21/21 1355 10/21/21 1430  ?BP: (!) 134/92 130/77  ?Pulse: (!) 138 (!) 112  ?Resp:  (!) 9  ?Temp: 98.2 ?F (36.8 ?C)   ?SpO2: 99% 99%  ? ? ? ?General: Awake, no distress.  ?CV:  Good peripheral perfusion.  Regular rate rhythm, heart rate 90 ?Resp:  Normal effort.  ?Abd:  No distention.  Soft and nontender.  No mass ?Other:  Pelvic exam  performed with tech Yvette.  External exam unremarkable.  Speculum exam negative for retained foreign body.  No evidence of cervicitis.  There is a small amount of thick white discharge.  Bimanual exam negative for mass.  No adnexal tenderness.  No CMT. ? ? ?ED Results / Procedures / Treatments  ? ?Labs ?(all labs ordered are listed, but only abnormal results are displayed) ?Labs Reviewed  ?COMPREHENSIVE METABOLIC PANEL - Abnormal; Notable for the following components:  ?    Result Value  ? Glucose, Bld 110 (*)   ? Total Protein 8.8 (*)   ? Albumin 5.2 (*)   ? All other components within normal limits  ?CBC - Abnormal; Notable for the following components:  ? RBC 5.72 (*)   ? Hemoglobin 16.3 (*)   ? HCT 49.5 (*)   ? All other components within normal limits  ?WET PREP, GENITAL  ?CHLAMYDIA/NGC RT PCR ([facility] ONLY)            ?LIPASE, BLOOD  ?URINALYSIS, ROUTINE W  REFLEX MICROSCOPIC  ?POC URINE PREG, ED  ? ? ? ?EKG ?Interpreted by me ?Sinus tachycardia rate 121.  Normal axis, normal intervals.  Normal QRS ST segments and T waves.  No ischemic changes or evidence of right heart strain. ? ? ? ?RADIOLOGY ? ? ? ? ?PROCEDURES: ? ?Critical Care performed: No ? ?Procedures ? ? ?MEDICATIONS ORDERED IN ED: ?Medications - No data to display ? ? ?IMPRESSION / MDM / ASSESSMENT AND PLAN / ED COURSE  ?I reviewed the triage vital signs and the nursing notes. ?             ?               ? ?Differential diagnosis includes, but is not limited to, UTI, vaginal candidiasis, trichomoniasis, bacterial vaginosis, anxiety, dehydration ? ?Patient presents with pelvic pain, tachycardia.  Other vital signs are normal.  She is not septic.  She is nontoxic.  Exam is otherwise reassuring without evidence of vaginal foreign body or bleeding or infection.  Wet prep is normal.  Serum labs are unremarkable.  After patient arrived to treatment room and was able to settle in, and received some reassurance after exam and labs were completed, heart rate  did improve on its own to about 90 suggesting an anxiety component to the tachycardia. ? ?We will give IV fluids for hydration, obtain urinalysis to provide antibiotics as indicated for cystitis. ? ? ?  ? ? ?FINAL CLINICAL IMPRESSION(S) / ED DIAGNOSES  ? ?Final diagnoses:  ?Pelvic pain  ? ? ? ?Rx / DC Orders  ? ?ED Discharge Orders   ? ? None  ? ?  ? ? ? ?Note:  This document was prepared using Dragon voice recognition software and may include unintentional dictation errors. ?  ?Sharman Cheek, MD ?10/21/21 1545 ? ?

## 2021-10-21 NOTE — ED Triage Notes (Signed)
C/O lightheadedness and dizziness today.  States HR is 140. Also c/o abdominal pain x 2 hours. ? ?AAOx3.  Skin warm and dry. NAD ?

## 2022-03-27 IMAGING — CT CT ANGIO CHEST
2 of 6 series · 19 of 46 positions shown · IV contrast (APPLIED)
Comparison: Chest radiograph 03/23/2020

CLINICAL DATA: High probability suspicion of pulmonary embolus.
C-section on [DATE][REDACTED] with shortness breath and chest pain since
then.

EXAM:
CT ANGIOGRAPHY CHEST WITH CONTRAST
TECHNIQUE: Multidetector CT imaging of the chest was performed using the
standard protocol during bolus administration of intravenous
contrast. Multiplanar CT image reconstructions and MIPs were
obtained to evaluate the vascular anatomy.
CONTRAST:  75mL OMNIPAQUE IOHEXOL 350 MG/ML SOLN

[Series 5: thins · axial · 0.60mm/px · z∈[-641,-423]mm · 16 of 240 slices shown]
[im 11/240  lung]
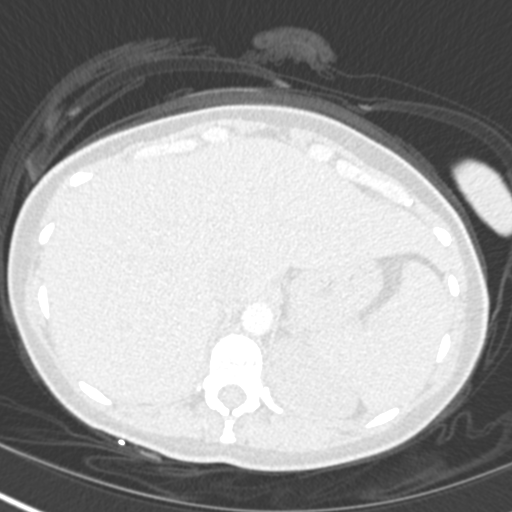
[im 32/240  soft-tissue]
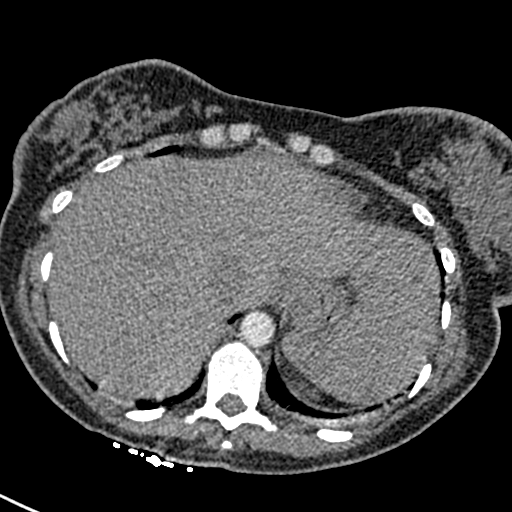
[im 42/240  lung]
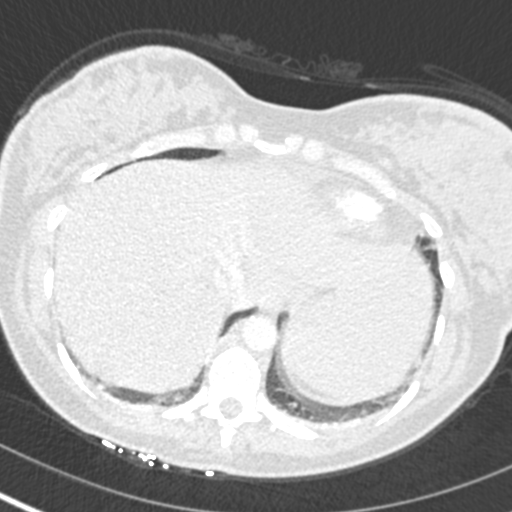
[im 52/240  soft-tissue]
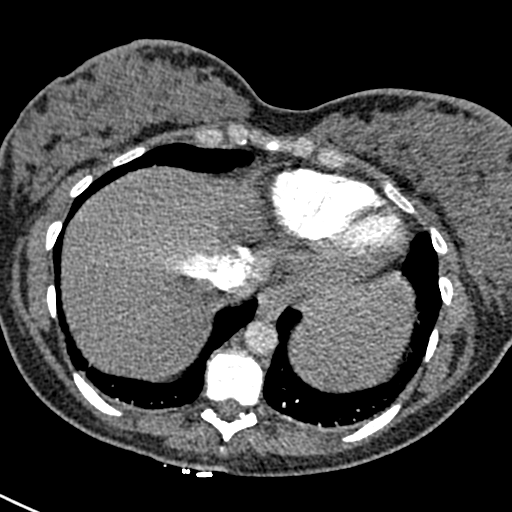
[im 73/240  lung]
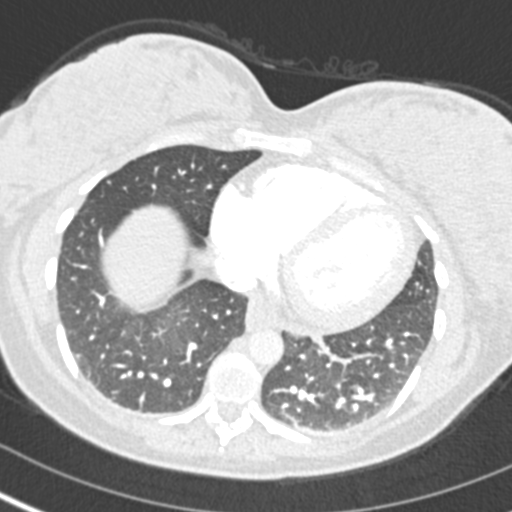
[im 84/240  soft-tissue]
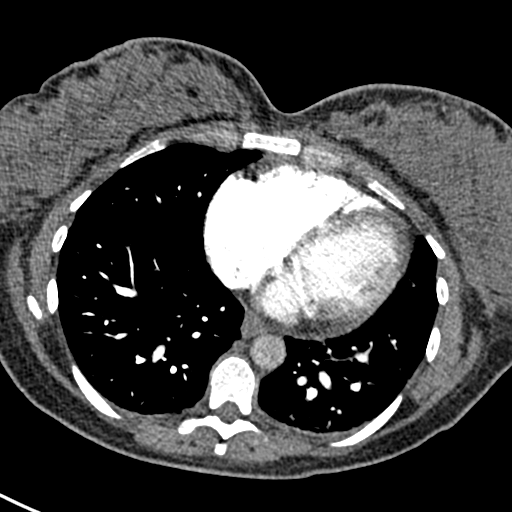
[im 94/240  lung]
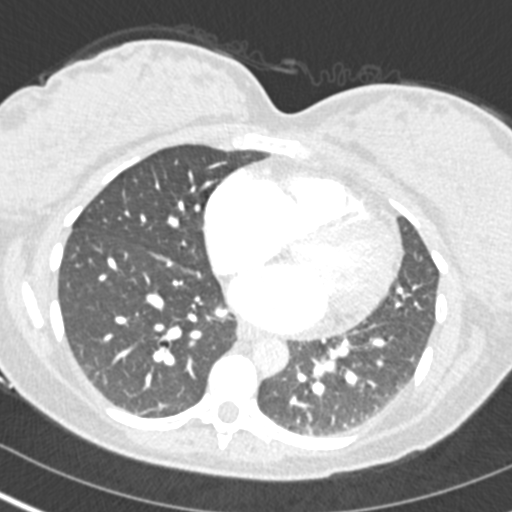
[im 115/240  soft-tissue]
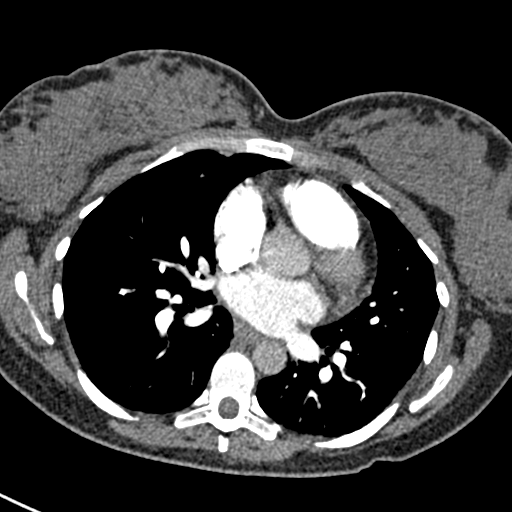
[im 125/240  lung]
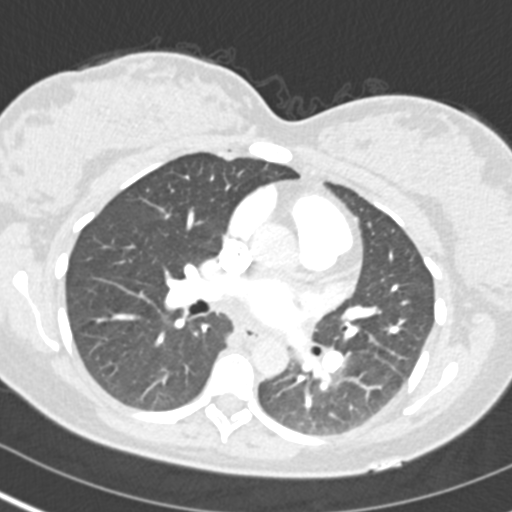
[im 146/240  soft-tissue]
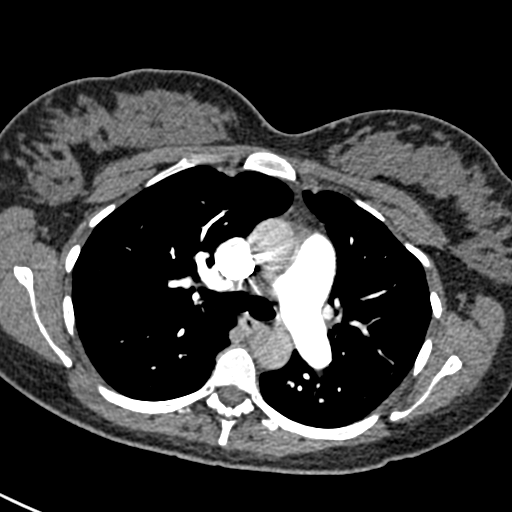
[im 156/240  lung]
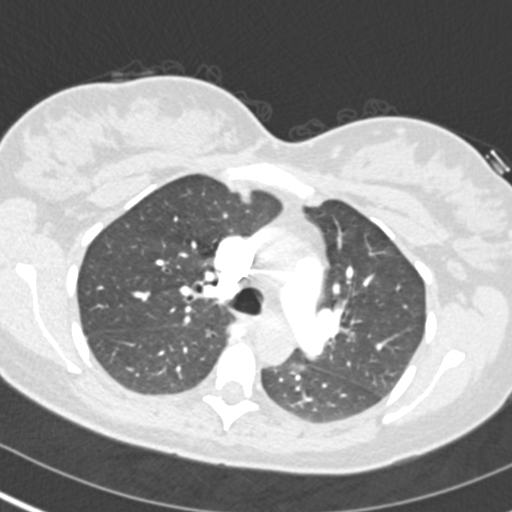
[im 167/240  soft-tissue]
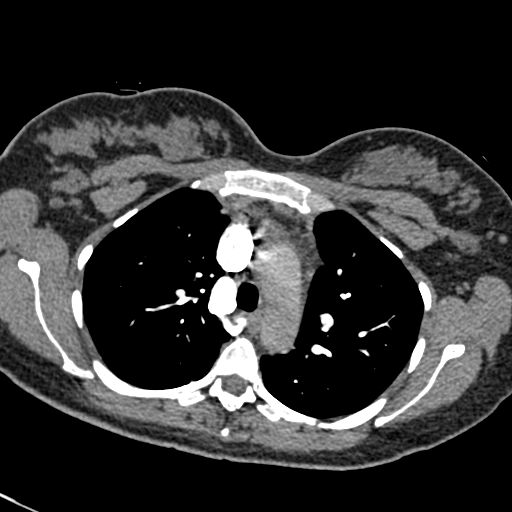
[im 188/240  lung]
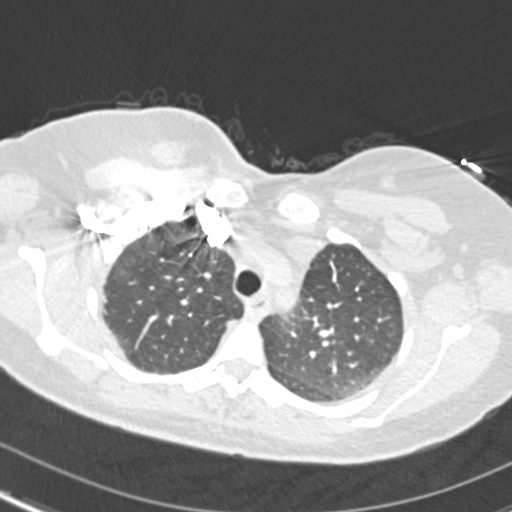
[im 198/240  soft-tissue]
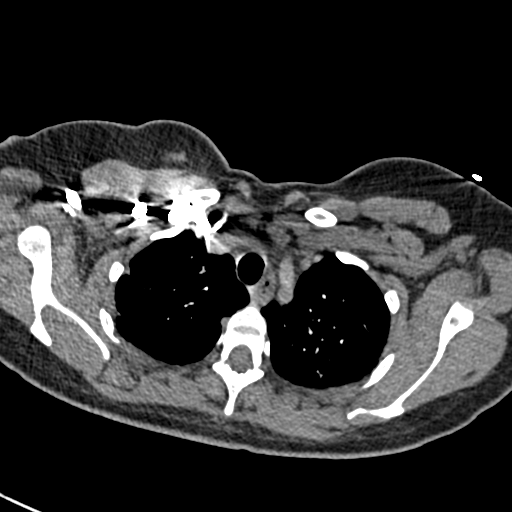
[im 208/240  lung]
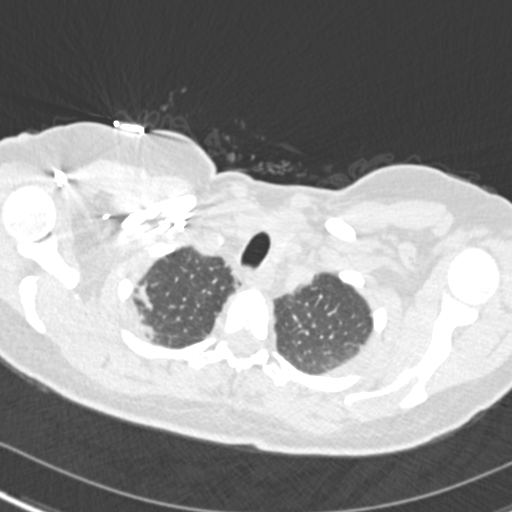
[im 229/240  soft-tissue]
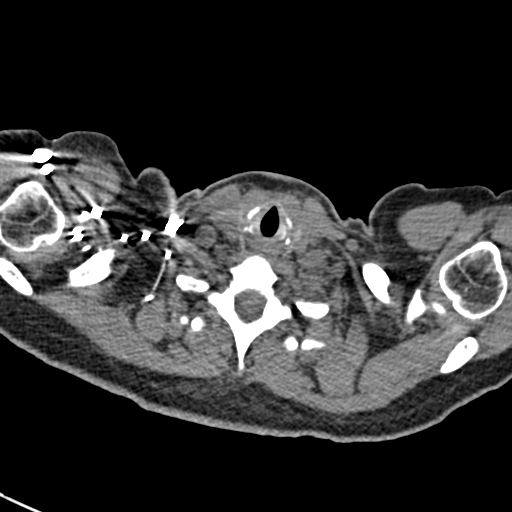

[Series 7: coronal mpr · coronal · 0.49mm/px · 3 of 74 slices shown]
[im 19/74  soft-tissue]
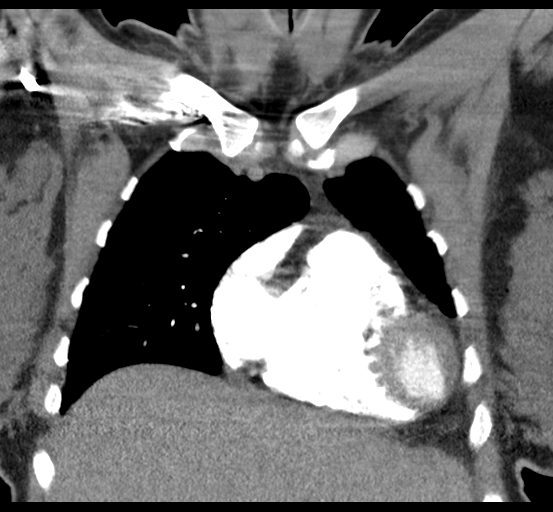
[im 37/74  soft-tissue]
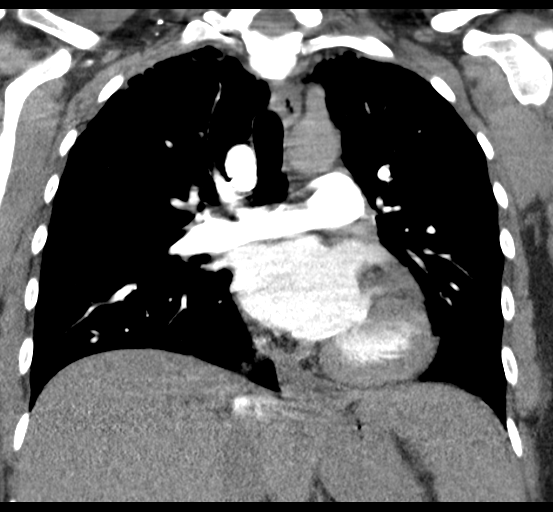
[im 55/74  soft-tissue]
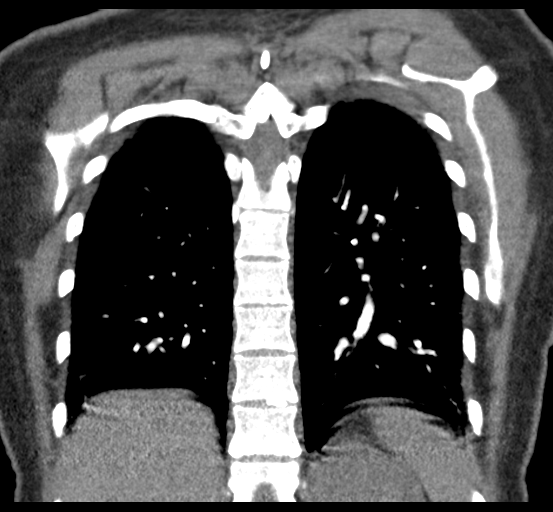

[19 of 46 positions shown; findings below may reference images not displayed]

FINDINGS: Cardiovascular: Good opacification of the central and segmental
pulmonary arteries. No focal filling defects. No evidence of
significant pulmonary embolus. Normal heart size. No pericardial
effusions. Normal caliber thoracic aorta.

Mediastinum/Nodes: No enlarged mediastinal, hilar, or axillary lymph
nodes. Thyroid gland, trachea, and esophagus demonstrate no
significant findings.

Lungs/Pleura: Lungs are clear. No pleural effusion or pneumothorax.

Upper Abdomen: No acute abnormality.

Musculoskeletal: No chest wall abnormality. No acute or significant
osseous findings.

Review of the MIP images confirms the above findings.
IMPRESSION: 1. No evidence of significant pulmonary embolus.
2. No evidence of active pulmonary disease.

## 2022-03-27 IMAGING — CR DG CHEST 2V
1 series · 2 of 2 positions shown · non-contrast
Comparison: None.

CLINICAL DATA: Shortness of breath and chest pain since a C-section
on [DATE][REDACTED].

EXAM:
CHEST - 2 VIEW

[Series 1: dg chest 2 view · 0.14mm/px · 2 of 2 slices shown]
[im 1/2]
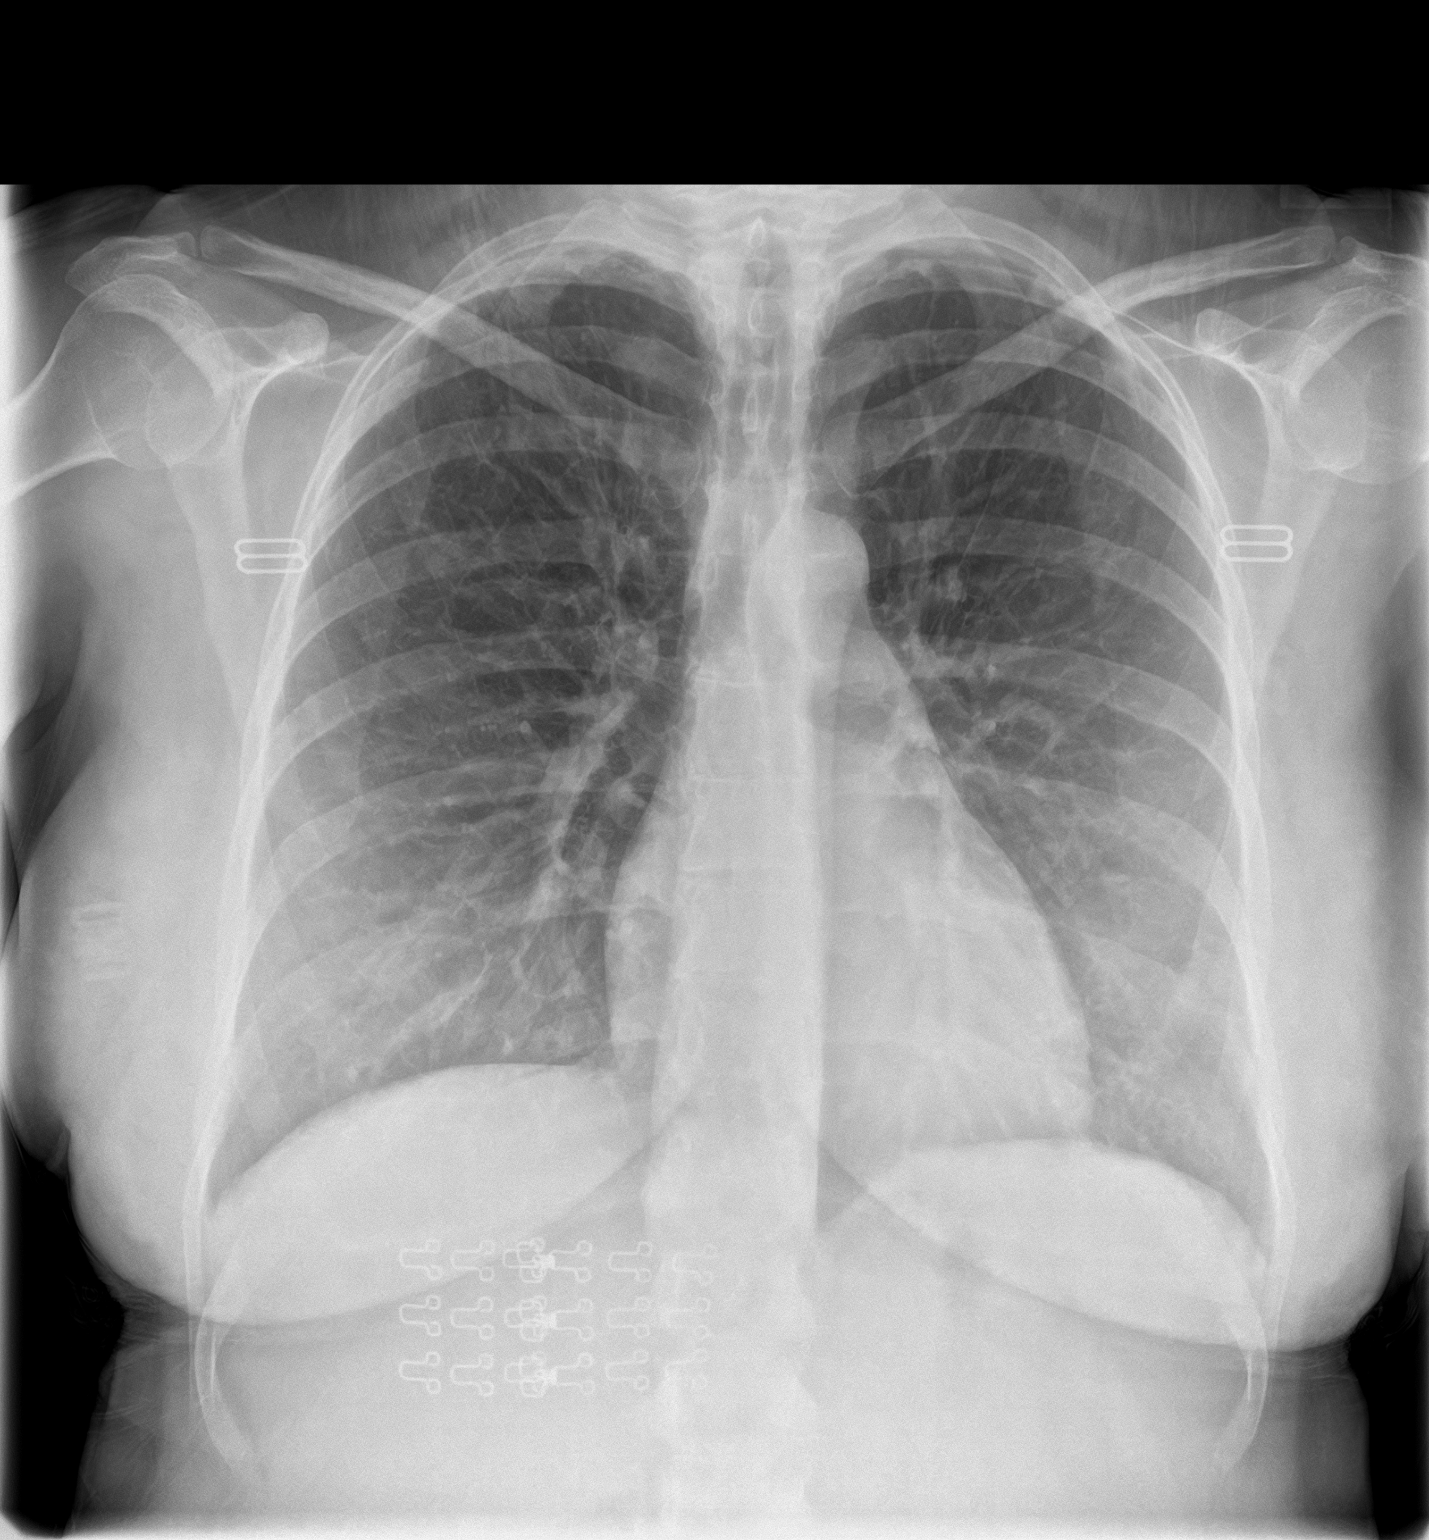
[im 2/2]
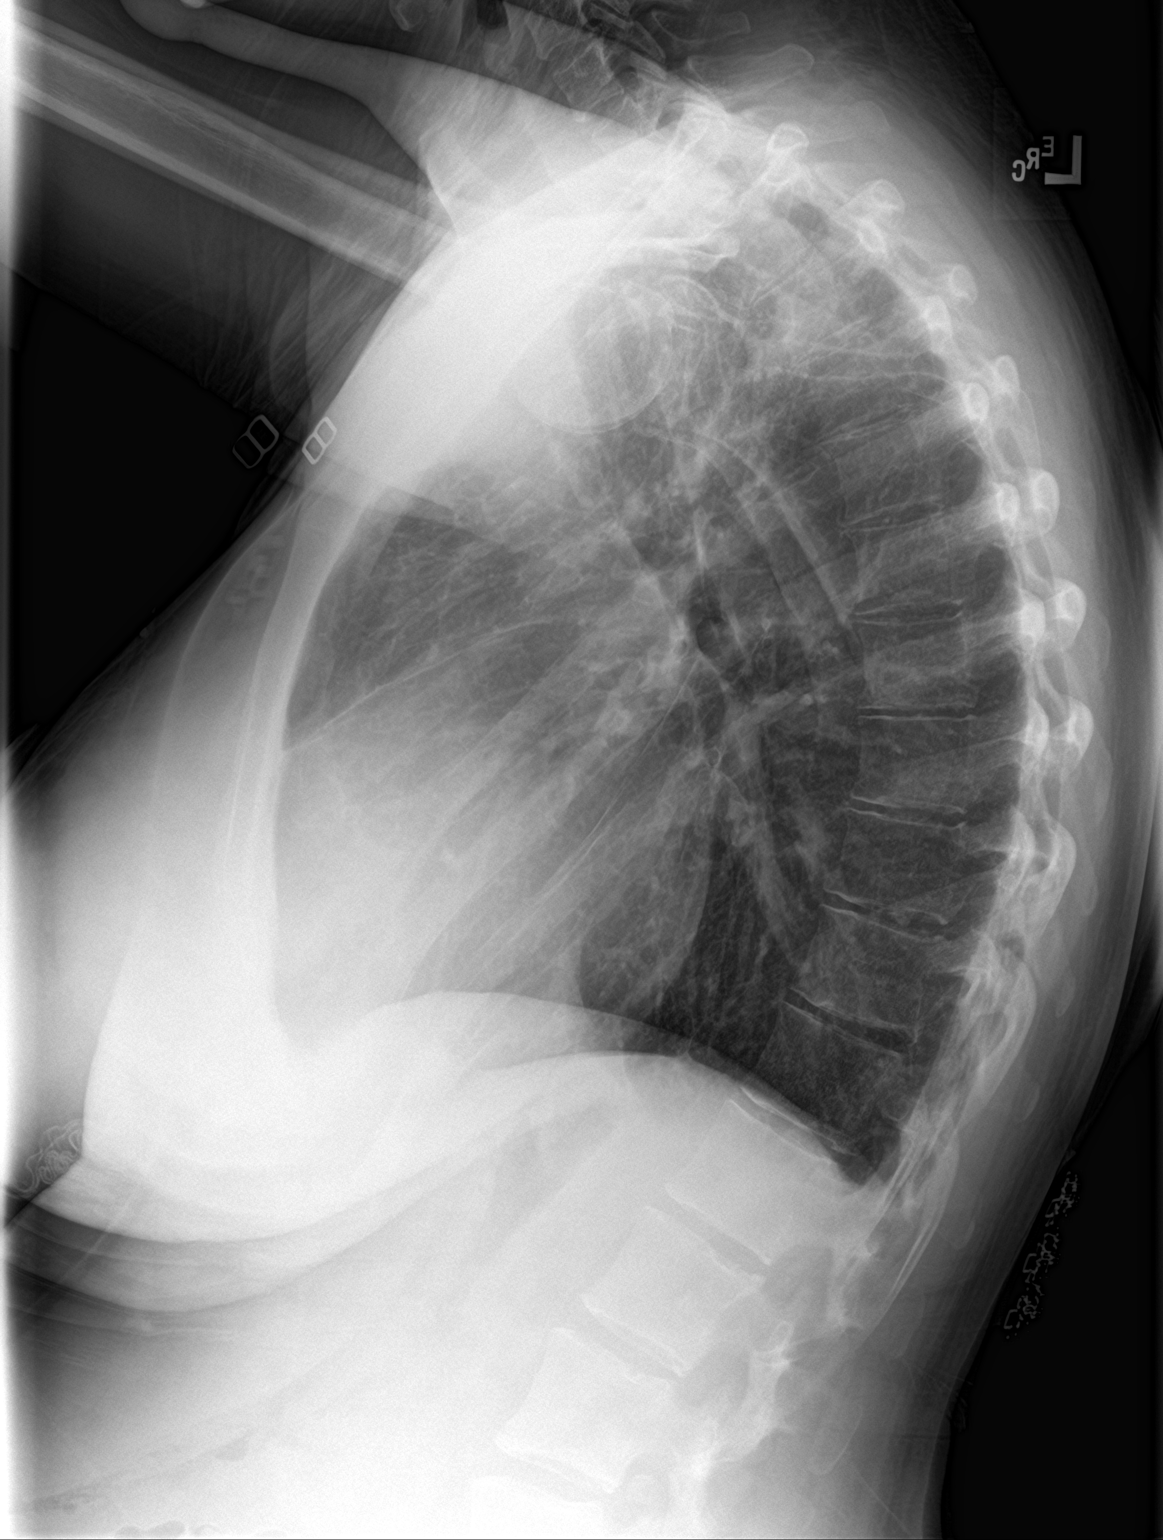

[2 of 2 positions shown; findings below may reference images not displayed]

FINDINGS: The heart size and mediastinal contours are within normal limits.
Both lungs are clear. The visualized skeletal structures are
unremarkable.
IMPRESSION: No active cardiopulmonary disease.

## 2022-10-27 ENCOUNTER — Ambulatory Visit: Payer: Self-pay

## 2022-10-27 NOTE — Telephone Encounter (Signed)
  Chief Complaint: flank pain  Symptoms: R flank pain, UA showed hematuria, nausea and dizziness at times  Frequency: ongoing since Saturday  Pertinent Negatives: Patient denies vomiting or severe pain present  Disposition: [] ED /[x] Urgent Care (no appt availability in office) / [] Appointment(In office/virtual)/ []  [place] [facility]/ [] Home Care/ [] Refused Recommended Disposition /[] [place] [facility]/ []  Follow-up with PCP Additional Notes: [facility] and UA was negative for UTI but showed hematuria so pt thinks she may have Kidney stone. 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 Advised her she can go to UC for eval. Pt prefers to wait until pain gets worse before going. Currently pain only 2/10 so pt wants to see if she can pass on her own without difficulty. Advised pt if she needs further assistance to CB and speak with NT again. Pt verbalized understanding.   Reason for Disposition  [1] MILD pain (i.e., scale 1-3; does not interfere with normal activities) AND [2] present > 3 days  Answer Assessment - Initial Assessment Questions 1. LOCATION: "Where does it hurt?" (e.g., left, right)     R flank pain  2. ONSET: "When did the pain start?"     Ongoing for couple of days  3. SEVERITY: "How bad is the pain?" (e.g., Scale 1-10; mild, moderate, or severe)   - MILD (1-3): doesn't interfere with normal activities    - MODERATE (4-7): interferes with normal activities or awakens from sleep    - SEVERE (8-10): excruciating pain and patient unable to do normal activities (stays in bed)       2 4. PATTERN: "Does the pain come and go, or is it constant?"      Comes and goes  5. CAUSE: "What do you think is causing the pain?"     Possible kidney stone  6. OTHER SYMPTOMS:  "Do you have any other symptoms?" (e.g., fever, abdomen pain, vomiting, leg weakness, burning with urination, blood in urine)     UA showed a lot of blood in urine, nausea,  dizziness  Protocols used: Flank Pain-A-AH

## 2022-10-29 ENCOUNTER — Encounter: Payer: Self-pay | Admitting: Emergency Medicine

## 2022-10-29 ENCOUNTER — Emergency Department: Payer: Managed Care, Other (non HMO)

## 2022-10-29 ENCOUNTER — Emergency Department
Admission: EM | Admit: 2022-10-29 | Discharge: 2022-10-29 | Disposition: A | Discharge status: Home / Self Care | Payer: Managed Care, Other (non HMO) | Attending: Emergency Medicine | Admitting: Emergency Medicine

## 2022-10-29 DIAGNOSIS — R1110 Vomiting, unspecified: Secondary | ICD-10-CM | POA: Insufficient documentation

## 2022-10-29 DIAGNOSIS — R197 Diarrhea, unspecified: Secondary | ICD-10-CM | POA: Insufficient documentation

## 2022-10-29 DIAGNOSIS — R319 Hematuria, unspecified: Secondary | ICD-10-CM | POA: Diagnosis present

## 2022-10-29 DIAGNOSIS — R000 Tachycardia, unspecified: Secondary | ICD-10-CM | POA: Insufficient documentation

## 2022-10-29 DIAGNOSIS — N12 Tubulo-interstitial nephritis, not specified as acute or chronic: Secondary | ICD-10-CM | POA: Insufficient documentation

## 2022-10-29 LAB — LACTIC ACID, PLASMA: Lactic Acid, Venous: 0.9 mmol/L (ref 0.5–1.9)

## 2022-10-29 LAB — COMPREHENSIVE METABOLIC PANEL
ALT: 12 U/L (ref 0–44)
AST: 14 U/L — ABNORMAL LOW (ref 15–41)
Albumin: 4.9 g/dL (ref 3.5–5.0)
Alkaline Phosphatase: 57 U/L (ref 38–126)
Anion gap: 10 (ref 5–15)
BUN: 20 mg/dL (ref 6–20)
CO2: 27 mmol/L (ref 22–32)
Calcium: 9 mg/dL (ref 8.9–10.3)
Chloride: 99 mmol/L (ref 98–111)
Creatinine, Ser: 0.67 mg/dL (ref 0.44–1.00)
GFR, Estimated: >60 mL/min (ref >60)
Glucose, Bld: 114 mg/dL — ABNORMAL HIGH (ref 70–99)
Potassium: 3.8 mmol/L (ref 3.5–5.1)
Sodium: 136 mmol/L (ref 135–145)
Total Bilirubin: 1.5 mg/dL — ABNORMAL HIGH (ref 0.3–1.2)
Total Protein: 8.2 g/dL — ABNORMAL HIGH (ref 6.5–8.1)

## 2022-10-29 LAB — CBC WITH DIFFERENTIAL/PLATELET
Abs Immature Granulocytes: 0.02 10*3/uL (ref 0.00–0.07)
Basophils Absolute: 0 10*3/uL (ref 0.0–0.1)
Basophils Relative: 0 %
Eosinophils Absolute: 0.3 10*3/uL (ref 0.0–0.5)
Eosinophils Relative: 3 %
HCT: 49.4 % — ABNORMAL HIGH (ref 36.0–46.0)
Hemoglobin: 16.1 g/dL — ABNORMAL HIGH (ref 12.0–15.0)
Immature Granulocytes: 0 %
Lymphocytes Relative: 6 %
Lymphs Abs: 0.6 10*3/uL — ABNORMAL LOW (ref 0.7–4.0)
MCH: 28.2 pg (ref 26.0–34.0)
MCHC: 32.6 g/dL (ref 30.0–36.0)
MCV: 86.7 fL (ref 80.0–100.0)
Monocytes Absolute: 0.6 10*3/uL (ref 0.1–1.0)
Monocytes Relative: 5 %
Neutro Abs: 8.9 10*3/uL — ABNORMAL HIGH (ref 1.7–7.7)
Neutrophils Relative %: 86 %
Platelets: 188 10*3/uL (ref 150–400)
RBC: 5.7 MIL/uL — ABNORMAL HIGH (ref 3.87–5.11)
RDW: 13.2 % (ref 11.5–15.5)
WBC: 10.4 10*3/uL (ref 4.0–10.5)
nRBC: 0 % (ref 0.0–0.2)

## 2022-10-29 LAB — URINALYSIS, ROUTINE W REFLEX MICROSCOPIC
Bilirubin Urine: NEGATIVE
Glucose, UA: NEGATIVE mg/dL
Ketones, ur: 80 mg/dL — AB
Nitrite: NEGATIVE
Protein, ur: 100 mg/dL — AB
RBC / HPF: >50 RBC/hpf (ref 0–5)
Specific Gravity, Urine: 1.025 (ref 1.005–1.030)
WBC, UA: >50 WBC/hpf (ref 0–5)
pH: 6 (ref 5.0–8.0)

## 2022-10-29 LAB — HCG, QUANTITATIVE, PREGNANCY: hCG, Beta Chain, Quant, S: 1 m[IU]/mL (ref <5)

## 2022-10-29 LAB — POC URINE PREG, ED: Preg Test, Ur: NEGATIVE

## 2022-10-29 MED ORDER — ONDANSETRON 4 MG PO TBDP: 4.0000 mg | ORAL_TABLET | Freq: Three times a day (TID) | ORAL | 0 refills | Status: DC | PRN

## 2022-10-29 MED ORDER — LACTATED RINGERS IV BOLUS
1000.0000 mL | Freq: Once | INTRAVENOUS | Status: AC
Start: 2022-10-29 — End: 2022-10-29
  Administered 2022-10-29: 1000 mL via INTRAVENOUS

## 2022-10-29 MED ORDER — IOHEXOL 300 MG/ML  SOLN
80.0000 mL | Freq: Once | INTRAMUSCULAR | Status: AC | PRN
  Administered 2022-10-29: 80 mL via INTRAVENOUS

## 2022-10-29 MED ORDER — CEFTRIAXONE SODIUM 1 G IJ SOLR
1.0000 g | Freq: Once | INTRAMUSCULAR | Status: AC
  Administered 2022-10-29: 1 g via INTRAVENOUS
  Filled 2022-10-29: qty 10

## 2022-10-29 MED ORDER — CEFDINIR 300 MG PO CAPS: 300.0000 mg | ORAL_CAPSULE | Freq: Two times a day (BID) | ORAL | 0 refills | Status: AC

## 2022-10-29 MED ORDER — ONDANSETRON HCL 4 MG/2ML IJ SOLN
4.0000 mg | Freq: Once | INTRAMUSCULAR | Status: AC
  Administered 2022-10-29: 4 mg via INTRAVENOUS
  Filled 2022-10-29: qty 2

## 2022-10-29 NOTE — ED Provider Notes (Addendum)
[facility] Provider Note    Event Date/Time   First MD Initiated Contact with Patient 10/29/22 0413     (approximate)   History   Flank Pain   HPI  Hailey Pennington is a 37 y.o. female because of flank pain abdominal pain vomiting diarrhea.  Patient notes that last Saturday, about 5 days ago she had right-sided flank pain.  Went to urgent care and was told she did not have a UTI but did have some blood in her urine and protein.  Pain had not overall been doing okay was intermittent and then last night developed vomiting and diarrhea and multiple episodes.  Has had some chills no fevers no urinary symptoms.  Does have some ongoing right flank pain but it is not as severe as it was on Saturday.     Past Medical History:  Diagnosis Date   Herpes    History of HPV infection    Seizures ([place])    Syringomyelia ([place])     There are no problems to display for this patient.    Physical Exam  Triage Vital Signs: ED Triage Vitals [10/29/22 0213]  Enc Vitals Group     BP 101/82     Pulse Rate (!) 125     Resp 18     Temp 99.1 F (37.3 C)     Temp src      SpO2 100 %     Weight 116 lb (52.6 kg)     Height '5\' 1"'$  (1.549 m)     Head Circumference      Peak Flow      Pain Score 5     Pain Loc      Pain Edu?      Excl. in [place]?     Most recent vital signs: Vitals:   10/29/22 0213 10/29/22 0634  BP: 101/82 109/66  Pulse: (!) 125 89  Resp: 18 19  Temp: 99.1 F (37.3 C)   SpO2: 100% 100%     General: Awake, no distress.  CV:  Good peripheral perfusion.  Resp:  Normal effort.  Abd:  No distention.  Mild tenderness palpation in the right lower abdomen, no CVA tenderness Neuro:             Awake, Alert, Oriented x 3  Other:     ED Results / Procedures / Treatments  Labs (all labs ordered are listed, but only abnormal results are displayed) Labs Reviewed  URINALYSIS, ROUTINE W REFLEX MICROSCOPIC - Abnormal; Notable for the following components:       Result Value   Color, Urine YELLOW (*)    APPearance HAZY (*)    Hgb urine dipstick MODERATE (*)    Ketones, ur 80 (*)    Protein, ur 100 (*)    Leukocytes,Ua MODERATE (*)    Bacteria, UA RARE (*)    All other components within normal limits  CBC WITH DIFFERENTIAL/PLATELET - Abnormal; Notable for the following components:   RBC 5.70 (*)    Hemoglobin 16.1 (*)    HCT 49.4 (*)    Neutro Abs 8.9 (*)    Lymphs Abs 0.6 (*)    All other components within normal limits  COMPREHENSIVE METABOLIC PANEL - Abnormal; Notable for the following components:   Glucose, Bld 114 (*)    Total Protein 8.2 (*)    AST 14 (*)    Total Bilirubin 1.5 (*)    All other components within normal limits  LACTIC ACID, PLASMA  HCG, QUANTITATIVE, PREGNANCY  POC URINE PREG, ED     EKG     RADIOLOGY CT abdomen pelvis reviewed interpreted myself shows ill-defined lesion left upper pole kidney possible pyelonephritis   PROCEDURES:  Critical Care performed: No  Procedures  The patient is on the cardiac monitor to evaluate for evidence of arrhythmia and/or significant heart rate changes.   MEDICATIONS ORDERED IN ED: Medications  lactated ringers bolus 1,000 mL (0 mLs Intravenous Stopped 10/29/22 0554)  ondansetron (ZOFRAN) injection 4 mg (4 mg Intravenous Given 10/29/22 0506)  iohexol (OMNIPAQUE) 300 MG/ML solution 80 mL (80 mLs Intravenous Contrast Given 10/29/22 0548)  cefTRIAXone (ROCEPHIN) 1 g in sodium chloride 0.9 % 100 mL IVPB (1 g Intravenous New Bag/Given 10/29/22 0618)     IMPRESSION / MDM / ASSESSMENT AND PLAN / ED COURSE  I reviewed the triage vital signs and the nursing notes.                              Patient's presentation is most consistent with acute complicated illness / injury requiring diagnostic workup.  Differential diagnosis includes, but is not limited to, pyonephritis, symptomatic nephrolithiasis, appendicitis, gastroenteritis  Patient is a 37 year old female  presents with nausea vomiting flank pain.  Initially had right-sided flank pain about 5 days ago that is been somewhat intermittent but not as bad as when it first started but then since last night has had vomiting diarrhea and some chills.  On arrival temp 99 and she is tachycardic.  Peers well somewhat dry has not been able to tolerate much p.o.  She has some mild tenderness in the right lower quadrant no significant CVA tenderness.  Labs are overall reassuring.  No leukocytosis lactate is negative normal renal function.  UA does have greater than 50 red cells and white cells.  Given her previous flank pain and right-sided Donnell pain I did obtain a CT of the abdomen pelvis to evaluate for pyelonephritis kidney stone appendicitis etc.  Interestingly there is findings to suggest likely left-sided pyelonephritis but no kidney stone that is obstructing no appendicitis.  Patient's UA is consistent with infection and with the CT findings we will treat as pyelonephritis.  Did discuss with patient that she will need to have follow-up imaging after resolution of symptoms and this illness to ensure that this irregularity on her kidney improves after treat with antibiotics.  Patient given ceftriaxone in the ED as well as Zofran and fluid bolus.  After which she was feeling improved.  She did tolerate p.o. challenge.  Given stable vitals not clinically septic and no obstruction I think she can be treated as an outpatient.  Will give cefdinir x 7 days and prescription for Zofran.  Discussed return precautions.       FINAL CLINICAL IMPRESSION(S) / ED DIAGNOSES   Final diagnoses:  Pyelonephritis     Rx / DC Orders   ED Discharge Orders          Ordered    cefdinir (OMNICEF) 300 MG capsule  2 times daily        10/29/22 0639    ondansetron (ZOFRAN-ODT) 4 MG disintegrating tablet  Every 8 hours PRN        10/29/22 0640             Note:  This document was prepared using Dragon voice recognition  software and may include unintentional dictation errors.  Rada Hay, MD 10/29/22 [id number]    Rada Hay, MD 10/29/22 [phone]

## 2022-10-29 NOTE — Discharge Instructions (Addendum)
You have a kidney infection.  Please take the antibiotic twice a day for the next 7 days.  You can take the Zofran as needed for nausea and vomiting.  Take Tylenol Motrin for pain.  You are unable to keep fluids down or continuing to have chills or fevers after 48 hours please return to the emergency department.  It does look like you have an abnormality on your left kidney which is likely pyelonephritis however you should have a repeat CT or MRI done after you finish treatment in several months to ensure that this has resolved.

## 2022-10-29 NOTE — ED Triage Notes (Signed)
Pt presents ambulatory to triage via POV with complaints of R sided flank pain that started 5 days ago. Pt states the she was seen at UC 4 days and was told that she had blood in her urine. No meds taken PTA. A&Ox4 at this time. Denies CP or SOB.

## 2022-11-12 ENCOUNTER — Other Ambulatory Visit: Payer: Self-pay | Admitting: Family Medicine

## 2022-11-12 DIAGNOSIS — N2889 Other specified disorders of kidney and ureter: Secondary | ICD-10-CM

## 2023-06-01 ENCOUNTER — Ambulatory Visit: Payer: BC Managed Care – PPO | Admitting: Nurse Practitioner

## 2023-06-18 ENCOUNTER — Ambulatory Visit (INDEPENDENT_AMBULATORY_CARE_PROVIDER_SITE_OTHER): Payer: BC Managed Care – PPO | Admitting: Adult Health

## 2023-06-18 VITALS — BP 80/60 | HR 77 | Temp 96.7°F | Ht 65.0 in

## 2023-06-18 DIAGNOSIS — N3090 Cystitis, unspecified without hematuria: Secondary | ICD-10-CM | POA: Diagnosis not present

## 2023-06-18 DIAGNOSIS — M545 Low back pain, unspecified: Secondary | ICD-10-CM

## 2023-06-18 LAB — POCT URINALYSIS DIPSTICK
Glucose, UA: NEGATIVE
Nitrite, UA: POSITIVE
Protein, UA: POSITIVE — AB
Spec Grav, UA: >=1.03 — AB (ref 1.010–1.025)
Urobilinogen, UA: NEGATIVE U/dL — AB
pH, UA: 5 (ref 5.0–8.0)

## 2023-06-18 LAB — POCT URINE PREGNANCY: Preg Test, Ur: NEGATIVE

## 2023-06-18 MED ORDER — CIPROFLOXACIN HCL 500 MG PO TABS: 500.0000 mg | ORAL_TABLET | Freq: Two times a day (BID) | ORAL | 0 refills | Status: DC

## 2023-06-18 NOTE — Progress Notes (Signed)
[facility] [address] [place], [place] [address] Phone: [phone] Fax: [phone]   Office Visit Note  Patient Name: Hailey Pennington  Date of HENID:[id number]  Med Rec number [id number]  Date of Service: 06/18/2023  Patient has no known allergies.  Chief Complaint  Patient presents with   Abdominal Pain    X 1 week associated with foul urine odor, thinks it is a possible uti    OTHER    Foul urine odor      Abdominal Pain Pertinent negatives include no fever.    Patient reports she has noticed a strong odor in her urine over the past week.  She has also been having low back pain, nausea. She has a history of UTI's, with similar symptoms.   She has been taking Tums, Pepto and Zofran.  She denies fever, chills, headache.  She took her temp once, and it was 99.9.    Current Medication:  Outpatient Encounter Medications as of 06/18/2023  Medication Sig   ciprofloxacin (CIPRO) 500 MG tablet Take 1 tablet (500 mg total) by mouth 2 (two) times daily.   LamoTRIgine 300 MG TB24 24 hour tablet Take by mouth.   ondansetron (ZOFRAN-ODT) 4 MG disintegrating tablet Take 1 tablet (4 mg total) by mouth every 8 (eight) hours as needed.   valACYclovir (VALTREX) 500 MG tablet Take 500 mg by mouth 2 (two) times daily.   folic acid (FOLVITE) 1 MG tablet Take 2 tablets (2 mg total) by mouth 2 (two) times daily. (Patient not taking: Reported on 06/18/2023)   [DISCONTINUED] cyclobenzaprine (FLEXERIL) 10 MG tablet Take 10 mg by mouth 3 (three) times daily. (Patient not taking: Reported on 06/18/2023)   [DISCONTINUED] medroxyPROGESTERone Acetate 150 MG/ML SUSY Inject 150 mg into the muscle every 3 (three) months. (Patient not taking: Reported on 06/18/2023)   [DISCONTINUED] meloxicam (MOBIC) 7.5 MG tablet Take 7.5 mg by mouth daily as needed. (Patient not taking: Reported on 06/18/2023)   [DISCONTINUED] miconazole (MICOTIN) 200 MG vaginal suppository Place 1 suppository (200 mg total)  vaginally at bedtime. (Patient not taking: Reported on 06/18/2023)   No facility-administered encounter medications on file as of 06/18/2023.      Medical History: Past Medical History:  Diagnosis Date   Herpes    History of HPV infection    Seizures (HCC)    Syringomyelia (HCC)      Vital Signs: BP (!) 80/60   Pulse 77   Temp (!) 96.7 F (35.9 C)   Ht 5\' 5"  (1.651 m)   SpO2 99%   BMI 19.30 kg/m    Review of Systems  Constitutional:  Positive for fatigue. Negative for fever.  HENT:  Negative for congestion, sinus pressure and sore throat.   Eyes:  Negative for pain and itching.  Respiratory:  Negative for cough.   Cardiovascular:  Negative for chest pain.  Gastrointestinal:  Positive for abdominal pain.  Genitourinary:  Positive for flank pain.    Physical Exam Vitals reviewed.  Constitutional:      Appearance: She is well-developed.  HENT:     Head: Normocephalic.  Pulmonary:     Effort: Pulmonary effort is normal.  Abdominal:     Tenderness: There is abdominal tenderness in the suprapubic area. There is left CVA tenderness. There is no right CVA tenderness.  Neurological:     Mental Status: She is alert.    Results for orders placed or performed in visit on 06/18/23 (from the past 24 hour(s))  POCT urinalysis dipstick     Status: Abnormal   Collection Time: 06/18/23  9:58 AM  Result Value Ref Range   Color, UA yellow    Clarity, UA cloudy    Glucose, UA Negative Negative   Bilirubin, UA small    Ketones, UA trace    Spec Grav, UA >=1.030 (A) 1.010 - 1.025   Blood, UA trace    pH, UA 5.0 5.0 - 8.0   Protein, UA Positive (A) Negative   Urobilinogen, UA negative (A) 0.2 or 1.0 E.U./dL   Nitrite, UA positive    Leukocytes, UA Trace (A) Negative   Appearance     Odor    POCT urine pregnancy     Status: None   Collection Time: 06/18/23 10:19 AM  Result Value Ref Range   Preg Test, Ur Negative Negative    Assessment/Plan: 1. Cystitis Take Cipro  every 12 hours (twice a day) with food x 7d; Finish all antibiotics. Drink plenty of water. Avoid or limit alcohol and caffeine, which may make symptoms worse. You may take an over-the-counter pain reliever (i.e., AZO urinary pain relief, Tylenol) as needed for pain next 1-2 days. Send secure message to provider or schedule return appointment as needed for new/worsening symptoms (such as fever or abdominal pain) if your symptoms are not improving after 2-3 days taking antibiotics or if your symptoms do not completely resolve following antibiotics.  - ciprofloxacin (CIPRO) 500 MG tablet; Take 1 tablet (500 mg total) by mouth 2 (two) times daily.  Dispense: 10 tablet; Refill: 0  2. Acute low back pain, unspecified back pain laterality, unspecified whether sciatica present - POCT urinalysis dipstick - POCT urine pregnancy        General Counseling: anam bobby understanding of the findings of todays visit and agrees with plan of treatment. I have discussed any further diagnostic evaluation that may be needed or ordered today. We also reviewed her medications today. she has been encouraged to call the office with any questions or concerns that should arise related to todays visit.   Orders Placed This Encounter  Procedures   POCT urinalysis dipstick   POCT urine pregnancy    Meds ordered this encounter  Medications   ciprofloxacin (CIPRO) 500 MG tablet    Sig: Take 1 tablet (500 mg total) by mouth 2 (two) times daily.    Dispense:  10 tablet    Refill:  0    Time spent:15 Minutes Time spent includes review of chart, medications, test results, and follow up plan with the patient.    Johnna Acosta AGNP-C Nurse Practitioner

## 2023-09-28 ENCOUNTER — Ambulatory Visit: Payer: Self-pay | Admitting: Urology

## 2023-11-05 ENCOUNTER — Encounter: Payer: Self-pay | Admitting: Urology

## 2023-11-05 ENCOUNTER — Ambulatory Visit (INDEPENDENT_AMBULATORY_CARE_PROVIDER_SITE_OTHER): Payer: Self-pay | Admitting: Urology

## 2023-11-05 VITALS — BP 101/61 | HR 73 | Ht 61.0 in | Wt 108.0 lb

## 2023-11-05 DIAGNOSIS — N390 Urinary tract infection, site not specified: Secondary | ICD-10-CM | POA: Diagnosis not present

## 2023-11-05 LAB — URINALYSIS, COMPLETE
Bilirubin, UA: NEGATIVE
Glucose, UA: NEGATIVE
Ketones, UA: NEGATIVE
Nitrite, UA: POSITIVE — AB
Specific Gravity, UA: 1.02 (ref 1.005–1.030)
Urobilinogen, Ur: 0.2 mg/dL (ref 0.2–1.0)
pH, UA: 6.5 (ref 5.0–7.5)

## 2023-11-05 LAB — MICROSCOPIC EXAMINATION: WBC, UA: >30 /HPF — AB (ref 0–5)

## 2023-11-05 LAB — BLADDER SCAN AMB NON-IMAGING

## 2023-11-05 MED ORDER — NITROFURANTOIN MONOHYD MACRO 100 MG PO CAPS: 100.0000 mg | ORAL_CAPSULE | Freq: Two times a day (BID) | ORAL | 0 refills | Status: DC

## 2023-11-05 MED ORDER — NITROFURANTOIN MACROCRYSTAL 50 MG PO CAPS: ORAL_CAPSULE | ORAL | 1 refills | Status: DC

## 2023-11-05 NOTE — Progress Notes (Addendum)
 I, Hailey Pennington, acting as a scribe for Riki Altes, MD., have documented all relevant documentation on the behalf of Riki Altes, MD, as directed by Riki Altes, MD while in the presence of Riki Altes, MD.  11/05/2023 12:19 PM   Hailey Pennington November 09, 1985 [id number]  Referring provider: Alm Bustard, NP [address] [place],  [place] [address]  Chief Complaint  Patient presents with   Recurrent UTI    HPI: Hailey Pennington is a 38 y.o. female referred for evaluation of recurrent UTIs.   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 On recent chart review, she had urine cultures positive for enterococcus faecalis with symptoms in January 2025 and May 2024.  She also had an ED visit March 2024, with flank pain, nausea, and vomiting. UA showed  significant RBC/WBC. Urine culture was not ordered. A CT was performed which showed bilateral, non-obstructing renal calculi, and changes in the upper portal of the left kidney consistent with pyelonephritis.  She is presently symptomatic. Her typical symptoms include worsening frequency, urgency, and voiding small amounts. She will also occasionally have pelvic pain and dysuria.  There may be an association of symptom onset to within 1-2 days of intercourse.   PMH: Past Medical History:  Diagnosis Date   Herpes    History of HPV infection    Seizures (HCC)    Syringomyelia (HCC)     Surgical History: Past Surgical History:  Procedure Laterality Date   CESAREAN SECTION     WISDOM TOOTH EXTRACTION      Home Medications:  Allergies as of 11/05/2023   No Known Allergies      Medication List        Accurate as of November 05, 2023 12:19 PM. If you have any questions, ask your nurse or doctor.          STOP taking these medications    ciprofloxacin 500 MG tablet Commonly known as: Cipro Stopped by: Riki Altes       TAKE these medications    folic acid 1 MG tablet Commonly known as:  FOLVITE Take 2 tablets (2 mg total) by mouth 2 (two) times daily.   LamoTRIgine 300 MG Tb24 24 hour tablet Take by mouth.   nitrofurantoin (macrocrystal-monohydrate) 100 MG capsule Commonly known as: MACROBID Take 1 capsule (100 mg total) by mouth every 12 (twelve) hours. Started by: Riki Altes   nitrofurantoin 50 MG capsule Commonly known as: MACRODANTIN 1 capsule p.o. after intercourse.  Do not start until current antibiotic course completed Started by: Verna Czech Kelsea Mousel   ondansetron 4 MG disintegrating tablet Commonly known as: ZOFRAN-ODT Take 1 tablet (4 mg total) by mouth every 8 (eight) hours as needed.   valACYclovir 500 MG tablet Commonly known as: VALTREX Take 500 mg by mouth 2 (two) times daily.        Allergies: No Known Allergies  Family History: Family History  Problem Relation Age of Onset   Other Mother        precancerous cells in uterine lining   Endometriosis Mother    Testicular cancer Maternal Uncle    Endometriosis Maternal Grandmother    Uterine cancer Maternal Grandmother        late years   Pancreatic cancer Maternal Grandfather        9   Colon cancer Paternal Grandfather    Cancer Paternal Grandfather        eye  Social History:  reports that she has been smoking. She has never used smokeless tobacco. She reports current alcohol use. She reports that she does not use drugs.   Physical Exam: BP 101/61   Pulse 73   Ht 5\' 1"  (1.549 m)   Wt 108 lb (49 kg)   BMI 20.41 kg/m   Constitutional:  Alert and oriented, No acute distress. HEENT: [place] AT Respiratory: Normal respiratory effort, no increased work of breathing. Psychiatric: Normal mood and affect.  Urinalysis Dipstick 1+ blood/2+ leukocyte/nitrite positive, microscopy >30 WBC/3-10 RBC    Assessment & Plan:    1. Recurrent UTI PVR mildly elevated at 134 mL.  Currently symptomatic with pyuria. Urine culture ordered.  Rx Macrobid 100 mg twice daily x7 days pending urine  culture report.  Macrodantin 50 mg after intercourse to start after she completes her current antibiotic course.  3 month follow-up with PVR.  We discussed supplements which may prevent UTIs including cranberry and D-mannose.  She was instructed to call for acute symptoms.  I have reviewed the above documentation for accuracy and completeness, and I agree with the above.   Riki Altes, MD  [facility] [address], [address] [place], [place] [address] [phone]

## 2023-11-05 NOTE — Patient Instructions (Signed)
 For UTI prevention take cranberry tablets, probiotic, D-Mannose daily.  Urinary Tract Infection, Female A urinary tract infection (UTI) is an infection in your urinary tract. The urinary tract is made up of organs that make, store, and get rid of pee (urine) in your body. These organs include: The kidneys. The ureters. The bladder. The urethra. What are the causes? Most UTIs are caused by germs called bacteria. They may be in or near your genitals. These germs grow and cause swelling in your urinary tract. What increases the risk? You're more likely to get a UTI if: You're a female. The urethra is shorter in females than in males. You have a soft tube called a catheter that drains your pee. You can't control when you pee or poop. You have trouble peeing because of: A kidney stone. A urinary blockage. A nerve condition that affects your bladder. Not getting enough to drink. You're sexually active. You use a birth control inside your vagina, like spermicide. You're pregnant. You have low levels of the hormone estrogen in your body. You're an older adult. You're also more likely to get a UTI if you have other health problems. These may include: Diabetes. A weak immune system. Your immune system is your body's defense system. Sickle cell disease. Injury of the spine. What are the signs or symptoms? Symptoms may include: Needing to pee right away. Peeing small amounts often. Pain or burning when you pee. Blood in your pee. Pee that smells bad or odd. Pain in your belly or lower back. You may also: Feel confused. This may be the first symptom in older adults. Vomit. Not feel hungry. Feel tired or easily annoyed. Have a fever or chills. How is this diagnosed? A UTI is diagnosed based on your medical history and an exam. You may also have other tests. These may include: Pee tests. Blood tests. Tests for sexually transmitted infections (STIs). If you've had more than one UTI,  you may need to have imaging studies done to find out why you keep getting them. How is this treated? A UTI can be treated by: Taking antibiotics or other medicines. Drinking enough fluid to keep your pee pale yellow. In rare cases, a UTI can cause a very bad condition called sepsis. Sepsis may be treated in the hospital. Follow these instructions at home: Medicines Take your medicines only as told by your health care provider. If you were given antibiotics, take them as told by your provider. Do not stop taking them even if you start to feel better. General instructions Make sure you: Pee often and fully. Do not hold your pee for a long time. Wipe from front to back after you pee or poop. Use each tissue only once when you wipe. Pee after you have sex. Do not douche or use sprays or powders in your genital area. Contact a health care provider if: Your symptoms don't get better after 1-2 days of taking antibiotics. Your symptoms go away and then come back. You have a fever or chills. You vomit or feel like you may vomit. Get help right away if: You have very bad pain in your back or lower belly. You faint. This information is not intended to replace advice given to you by your health care provider. Make sure you discuss any questions you have with your health care provider. Document Revised: 03/11/2023 Document Reviewed: 11/06/2022 Elsevier Patient Education  2024 [employer].

## 2023-11-08 ENCOUNTER — Encounter: Payer: Self-pay | Admitting: Urology

## 2023-11-09 ENCOUNTER — Other Ambulatory Visit: Payer: Self-pay | Admitting: *Deleted

## 2023-11-09 ENCOUNTER — Encounter: Payer: Self-pay | Admitting: *Deleted

## 2023-11-09 MED ORDER — CEFUROXIME AXETIL 250 MG PO TABS: 250.0000 mg | ORAL_TABLET | Freq: Two times a day (BID) | ORAL | 0 refills | Status: AC

## 2023-11-09 NOTE — Telephone Encounter (Signed)
 Left message and sent my chart message

## 2023-11-10 ENCOUNTER — Encounter: Payer: Self-pay | Admitting: Urology

## 2023-11-10 LAB — CULTURE, URINE COMPREHENSIVE

## 2023-11-28 ENCOUNTER — Other Ambulatory Visit: Payer: Self-pay

## 2023-11-28 ENCOUNTER — Observation Stay
Admission: EM | Admit: 2023-11-28 | Discharge: 2023-11-30 | Disposition: A | Discharge status: Home / Self Care | Attending: Emergency Medicine | Admitting: Emergency Medicine

## 2023-11-28 ENCOUNTER — Emergency Department

## 2023-11-28 DIAGNOSIS — F17200 Nicotine dependence, unspecified, uncomplicated: Secondary | ICD-10-CM | POA: Diagnosis not present

## 2023-11-28 DIAGNOSIS — N200 Calculus of kidney: Secondary | ICD-10-CM

## 2023-11-28 DIAGNOSIS — N201 Calculus of ureter: Secondary | ICD-10-CM

## 2023-11-28 DIAGNOSIS — N202 Calculus of kidney with calculus of ureter: Secondary | ICD-10-CM | POA: Diagnosis not present

## 2023-11-28 DIAGNOSIS — N390 Urinary tract infection, site not specified: Principal | ICD-10-CM

## 2023-11-28 DIAGNOSIS — G40909 Epilepsy, unspecified, not intractable, without status epilepticus: Secondary | ICD-10-CM

## 2023-11-28 DIAGNOSIS — Z79899 Other long term (current) drug therapy: Secondary | ICD-10-CM | POA: Diagnosis not present

## 2023-11-28 DIAGNOSIS — R1031 Right lower quadrant pain: Secondary | ICD-10-CM | POA: Diagnosis present

## 2023-11-28 LAB — URINALYSIS, ROUTINE W REFLEX MICROSCOPIC
Bilirubin Urine: NEGATIVE
Glucose, UA: NEGATIVE mg/dL
Ketones, ur: NEGATIVE mg/dL
Nitrite: NEGATIVE
Protein, ur: 100 mg/dL — AB
RBC / HPF: >50 RBC/hpf (ref 0–5)
Specific Gravity, Urine: 1.028 (ref 1.005–1.030)
WBC, UA: >50 WBC/hpf (ref 0–5)
pH: 5 (ref 5.0–8.0)

## 2023-11-28 LAB — CBC
HCT: 41.1 % (ref 36.0–46.0)
Hemoglobin: 13.5 g/dL (ref 12.0–15.0)
MCH: 29.3 pg (ref 26.0–34.0)
MCHC: 32.8 g/dL (ref 30.0–36.0)
MCV: 89.3 fL (ref 80.0–100.0)
Platelets: 185 10*3/uL (ref 150–400)
RBC: 4.6 MIL/uL (ref 3.87–5.11)
RDW: 12.8 % (ref 11.5–15.5)
WBC: 5.5 10*3/uL (ref 4.0–10.5)
nRBC: 0 % (ref 0.0–0.2)

## 2023-11-28 LAB — COMPREHENSIVE METABOLIC PANEL WITH GFR
ALT: 17 U/L (ref 0–44)
AST: 15 U/L (ref 15–41)
Albumin: 4.4 g/dL (ref 3.5–5.0)
Alkaline Phosphatase: 46 U/L (ref 38–126)
Anion gap: 10 (ref 5–15)
BUN: 16 mg/dL (ref 6–20)
CO2: 21 mmol/L — ABNORMAL LOW (ref 22–32)
Calcium: 8.9 mg/dL (ref 8.9–10.3)
Chloride: 105 mmol/L (ref 98–111)
Creatinine, Ser: 0.59 mg/dL (ref 0.44–1.00)
GFR, Estimated: >60 mL/min (ref >60)
Glucose, Bld: 93 mg/dL (ref 70–99)
Potassium: 3.9 mmol/L (ref 3.5–5.1)
Sodium: 136 mmol/L (ref 135–145)
Total Bilirubin: 1.2 mg/dL (ref 0.0–1.2)
Total Protein: 7.1 g/dL (ref 6.5–8.1)

## 2023-11-28 LAB — LIPASE, BLOOD: Lipase: 30 U/L (ref 11–51)

## 2023-11-28 LAB — PREGNANCY, URINE: Preg Test, Ur: NEGATIVE

## 2023-11-28 MED ORDER — SODIUM CHLORIDE 0.9% FLUSH
3.0000 mL | Freq: Two times a day (BID) | INTRAVENOUS | Status: DC
  Administered 2023-11-28 – 2023-11-29 (×3): 3 mL via INTRAVENOUS

## 2023-11-28 MED ORDER — HYDROMORPHONE HCL 1 MG/ML IJ SOLN
0.5000 mg | INTRAMUSCULAR | Status: DC | PRN
  Administered 2023-11-28 – 2023-11-29 (×2): 1 mg via INTRAVENOUS
  Administered 2023-11-29: 0.5 mg via INTRAVENOUS
  Administered 2023-11-29 – 2023-11-30 (×4): 1 mg via INTRAVENOUS
  Filled 2023-11-28 (×8): qty 1

## 2023-11-28 MED ORDER — KETOROLAC TROMETHAMINE 30 MG/ML IJ SOLN
30.0000 mg | Freq: Once | INTRAMUSCULAR | Status: AC
  Administered 2023-11-28: 30 mg via INTRAVENOUS
  Filled 2023-11-28: qty 1

## 2023-11-28 MED ORDER — SODIUM CHLORIDE 0.9 % IV SOLN
1.0000 g | INTRAVENOUS | Status: DC
  Filled 2023-11-28 (×2): qty 10

## 2023-11-28 MED ORDER — ONDANSETRON HCL 4 MG/2ML IJ SOLN
4.0000 mg | Freq: Four times a day (QID) | INTRAMUSCULAR | Status: DC | PRN
  Administered 2023-11-28 – 2023-11-30 (×3): 4 mg via INTRAVENOUS
  Filled 2023-11-28 (×2): qty 2

## 2023-11-28 MED ORDER — ONDANSETRON HCL 4 MG/2ML IJ SOLN
4.0000 mg | Freq: Once | INTRAMUSCULAR | Status: AC
  Administered 2023-11-28: 4 mg via INTRAVENOUS
  Filled 2023-11-28: qty 2

## 2023-11-28 MED ORDER — ONDANSETRON HCL 4 MG PO TABS
4.0000 mg | ORAL_TABLET | Freq: Four times a day (QID) | ORAL | Status: DC | PRN
Start: 2023-11-28 — End: 2023-11-30

## 2023-11-28 MED ORDER — MORPHINE SULFATE (PF) 4 MG/ML IV SOLN
4.0000 mg | Freq: Once | INTRAVENOUS | Status: AC
  Administered 2023-11-28: 4 mg via INTRAVENOUS
  Filled 2023-11-28: qty 1

## 2023-11-28 MED ORDER — LAMOTRIGINE ER 100 MG PO TB24
300.0000 mg | ORAL_TABLET | Freq: Every day | ORAL | Status: DC
  Administered 2023-11-29 – 2023-11-30 (×2): 300 mg via ORAL
  Filled 2023-11-28 (×2): qty 3

## 2023-11-28 MED ORDER — NICOTINE 21 MG/24HR TD PT24
21.0000 mg | MEDICATED_PATCH | Freq: Every day | TRANSDERMAL | Status: DC
  Administered 2023-11-28 – 2023-11-29 (×3): 21 mg via TRANSDERMAL
  Filled 2023-11-28 (×3): qty 1

## 2023-11-28 MED ORDER — POLYETHYLENE GLYCOL 3350 17 G PO PACK: 17.0000 g | PACK | Freq: Every day | ORAL | Status: DC | PRN

## 2023-11-28 MED ORDER — IOHEXOL 300 MG/ML  SOLN
100.0000 mL | Freq: Once | INTRAMUSCULAR | Status: AC | PRN
  Administered 2023-11-28: 75 mL via INTRAVENOUS

## 2023-11-28 MED ORDER — SODIUM CHLORIDE 0.9 % IV SOLN
1.0000 g | Freq: Once | INTRAVENOUS | Status: AC
  Administered 2023-11-28: 1 g via INTRAVENOUS
  Filled 2023-11-28: qty 10

## 2023-11-28 MED ORDER — ACETAMINOPHEN 325 MG PO TABS: 650.0000 mg | ORAL_TABLET | Freq: Four times a day (QID) | ORAL | Status: DC | PRN

## 2023-11-28 MED ORDER — ACETAMINOPHEN 650 MG RE SUPP: 650.0000 mg | Freq: Four times a day (QID) | RECTAL | Status: DC | PRN

## 2023-11-28 MED ORDER — KETOROLAC TROMETHAMINE 15 MG/ML IJ SOLN
15.0000 mg | Freq: Four times a day (QID) | INTRAMUSCULAR | Status: DC
  Administered 2023-11-28 – 2023-11-30 (×5): 15 mg via INTRAVENOUS
  Filled 2023-11-28 (×8): qty 1

## 2023-11-28 MED ORDER — NICOTINE 7 MG/24HR TD PT24: 7.0000 mg | MEDICATED_PATCH | Freq: Every day | TRANSDERMAL | Status: DC

## 2023-11-28 NOTE — H&P (Signed)
 History and Physical    Patient: Hailey Pennington ZOX:[id number] DOB: 1986-02-03 DOA: 11/28/2023 DOS: the patient was seen and examined on 11/28/2023 PCP: Will Hare, NP  Patient coming from: Home  Chief Complaint:  Chief Complaint  Patient presents with   Abdominal Pain   HPI: Hailey Pennington is a 38 y.o. female with medical history significant of Epilepsy, pituitary microadenoma, syringomyelia who presents to the ED due to right lower quadrant pain.  Hailey Pennington states Hailey Pennington has been experiencing urinary frequency, urgency with some right-sided flank pain for a few weeks now.  Hailey Pennington was started on Macrobid which initially did not alleviate her symptoms but Hailey Pennington was subsequently switched to a cefuroxime, that did help.  Unfortunately, her symptoms continue to wax and wane.  Then in the last 3 days, Hailey Pennington developed new onset right lower quadrant abdominal pain that was severe in nature.  Pain is intermittent.  Due to this, Hailey Pennington contacted her urologist office, who referred her to the ED.  Hailey Pennington denies any dysuria, but states Hailey Pennington does not usually experience dysuria with UTIs.  ED course: On arrival to the ED, patient was normotensive at 118/73 with heart rate of 94.  Hailey Pennington was saturating at 100% on room air.  Hailey Pennington was afebrile at 98.3.  Initial workup notable for unremarkable CBC and CMP.  Negative urine pregnancy test.  Urinalysis with significantly elevated squamous epithelial cells; also with hematuria, leukocytes, and many bacteria.  CT of the abdomen was obtained that demonstrated partially obstructing calculus at the right UPJ with right hydronephrosis in addition to an obstructing calculus in the distal right ureter.  Urology consulted and [facility] contacted for admission.   Review of Systems: As mentioned in the history of present illness. All other systems reviewed and are negative.  Past Medical History:  Diagnosis Date   Herpes    History of HPV infection    Seizures (HCC)    Syringomyelia (HCC)     Past Surgical History:  Procedure Laterality Date   CESAREAN SECTION     WISDOM TOOTH EXTRACTION     Social History:  reports that Hailey Pennington has been smoking. Hailey Pennington has never used smokeless tobacco. Hailey Pennington reports current alcohol use. Hailey Pennington reports that Hailey Pennington does not use drugs.  No Known Allergies  Family History  Problem Relation Age of Onset   Other Mother        precancerous cells in uterine lining   Endometriosis Mother    Testicular cancer Maternal Uncle    Endometriosis Maternal Grandmother    Uterine cancer Maternal Grandmother        late years   Pancreatic cancer Maternal Grandfather        10   Colon cancer Paternal Grandfather    Cancer Paternal Grandfather        eye    Prior to Admission medications   Medication Sig Start Date End Date Taking? Authorizing Provider  folic acid (FOLVITE) 1 MG tablet Take 2 tablets (2 mg total) by mouth 2 (two) times daily. 10/01/21   Copland, Alicia B, PA-C  LamoTRIgine 300 MG TB24 24 hour tablet Take by mouth. 10/17/15   [provider]  nitrofurantoin (MACRODANTIN) 50 MG capsule 1 capsule p.o. after intercourse.  Do not start until current antibiotic course completed 11/05/23   Stoioff, Kizzie Perks, MD  ondansetron (ZOFRAN-ODT) 4 MG disintegrating tablet Take 1 tablet (4 mg total) by mouth every 8 (eight) hours as needed. 10/29/22   Heather Litter, MD  valACYclovir (VALTREX) 500 MG tablet Take 500 mg by mouth 2 (two) times daily. 09/08/21   [provider]    Physical Exam: Vitals:   11/28/23 1245 11/28/23 1453 11/28/23 1717  BP: 118/73 101/70 105/66  Pulse: 94 71 73  Resp: 20 16 16   Temp: 98.3 F (36.8 C)  98.4 F (36.9 C)  TempSrc: Oral  Oral  SpO2: 100% 96% 98%   Physical Exam Vitals and nursing note reviewed.  Constitutional:      General: Hailey Pennington is not in acute distress.    Appearance: Hailey Pennington is normal weight.  HENT:     Head: Normocephalic and atraumatic.  Cardiovascular:     Rate and Rhythm: Normal rate and  regular rhythm.  Pulmonary:     Effort: Pulmonary effort is normal. No respiratory distress.     Breath sounds: Normal breath sounds. No wheezing.  Abdominal:     General: Bowel sounds are normal. There is no distension.     Palpations: Abdomen is soft.     Tenderness: There is no abdominal tenderness. There is no guarding.  Musculoskeletal:     Right lower leg: No edema.     Left lower leg: No edema.  Skin:    General: Skin is warm and dry.  Neurological:     Mental Status: Hailey Pennington is alert and oriented to person, place, and time. Mental status is at baseline.  Psychiatric:        Mood and Affect: Mood normal.        Behavior: Behavior normal.    Data Reviewed: CBC with WBC of 5.5, hemoglobin of 13.5, platelets of 185 CMP with sodium of 136, potassium 3.9, bicarb 21, BUN 16, creatinine 0.59, AST 15, ALT 17, GFR above 60 Urine pregnancy test negative Urinalysis with increased epithelial cells, in addition to moderate hematuria, moderate leukocytes, many bacteria  CT ABDOMEN PELVIS W CONTRAST Result Date: 11/28/2023 CLINICAL DATA:  RIGHT lower quadrant abdominal pain. EXAM: CT ABDOMEN AND PELVIS WITH CONTRAST TECHNIQUE: Multidetector CT imaging of the abdomen and pelvis was performed using the standard protocol following bolus administration of intravenous contrast. RADIATION DOSE REDUCTION: This exam was performed according to the departmental dose-optimization program which includes automated exposure control, adjustment of the mA and/or kV according to patient size and/or use of iterative reconstruction technique. CONTRAST:  75mL OMNIPAQUE IOHEXOL 300 MG/ML  SOLN COMPARISON:  CT abdomen pelvis 10/29/2022 FINDINGS: Lower chest: Lung bases are clear. Hepatobiliary: No focal hepatic lesion. Normal gallbladder. No biliary duct dilatation. Common bile duct is normal. Pancreas: Pancreas is normal. No ductal dilatation. No pancreatic inflammation. Spleen: Normal spleen Adrenals/urinary tract:  Adrenal glands normal. There is hydronephrosis of the RIGHT renal collecting system. There is a partially obstructing calculus at the RIGHT ureteropelvic junction measuring 10 mm (image 29/3) distal to the RIGHT ureteropelvic junction calculus the ureter remains dilated into the pelvis. There is a second obstructing calculus in the distal RIGHT ureter measuring 6 mm on image 66/3. This distal RIGHT ureteral calculus is within 1 cm of the RIGHT vesicoureteral junction. Single nonobstructing calculus in the upper pole of the LEFT kidney. Stomach/Bowel: Stomach, small bowel, appendix, and cecum are normal. The colon and rectosigmoid colon are normal. Vascular/Lymphatic: Abdominal aorta is normal caliber. No periportal or retroperitoneal adenopathy. No pelvic adenopathy. Reproductive: Uterus and ovaries are normal. There are prominent parametrial veins along the LEFT margin of the uterus which drain into the LEFT gonadal vein. Other: No free fluid. Musculoskeletal: No aggressive osseous  lesion. IMPRESSION: 1. Partially obstructing calculus at the RIGHT ureteropelvic junction. RIGHT hydronephrosis. 2. Obstructing calculus in the distal RIGHT ureter. RIGHT hydroureter. 3. Nonobstructing calculus in the upper pole of the LEFT kidney. 4. Normal appendix. 5. Prominent parametrial veins along the LEFT margin of the uterus which drain into the LEFT gonadal vein. Findings can be seen in the setting of pelvic congestion syndrome. Electronically Signed   By: Deboraha Fallow M.D.   On: 11/28/2023 16:49   There are no new results to review at this time.  Assessment and Plan:  Nephrolithiasis Patient presenting with several day history of increasing right lower quadrant pain, found to have 2 obstructing stones complicated by hydroureteronephrosis.  Renal function is intact without evidence of AKI.  Urology has been consulted with plans for stent later this evening.  - Urology consulted; appreciate their recommendations -  Continue pain control with Tylenol, Toradol, Dilaudid for breakthrough - N.p.o.  Complicated UTI (urinary tract infection) Patient has a history of recurrent UTI, most recently seen at the urologist office in March 2025, at which time Hailey Pennington was started on Macrobid, and then cefuroxime.  Urine culture at the time demonstrated pansensitive E. coli.  Hailey Pennington has continued to have urinary symptoms, potentially due to seeding of bacteria in the stones.  - Rocephin 1 g daily - Will discuss with urology obtaining intraoperative urine culture  Epilepsy, unspecified, not intractable, without status epilepticus (HCC) No recent breakthrough seizures.  - Continue home lamotrigine  Advance Care Planning:   Code Status: Full Code   Consults: Urology  Family Communication: Patient's husband updated at bedside  Severity of Illness: The appropriate patient status for this patient is OBSERVATION. Observation status is judged to be reasonable and necessary in order to provide the required intensity of service to ensure the patient's safety. The patient's presenting symptoms, physical exam findings, and initial radiographic and laboratory data in the context of their medical condition is felt to place them at decreased risk for further clinical deterioration. Furthermore, it is anticipated that the patient will be medically stable for discharge from the hospital within 2 midnights of admission.   Author: Avi Body, MD 11/28/2023 7:47 PM  For on call review [email or web address].

## 2023-11-28 NOTE — Assessment & Plan Note (Addendum)
 Patient presenting with several day history of increasing right lower quadrant pain, found to have 2 obstructing stones complicated by hydroureteronephrosis.  Renal function is intact without evidence of AKI.  Urology has been consulted with plans for stent later this evening.  - Urology consulted; appreciate their recommendations - Continue pain control with Tylenol, Toradol, Dilaudid for breakthrough - N.p.o.

## 2023-11-28 NOTE — Assessment & Plan Note (Signed)
 No recent breakthrough seizures.  - Continue home lamotrigine

## 2023-11-28 NOTE — Assessment & Plan Note (Addendum)
 Patient has a history of recurrent UTI, most recently seen at the urologist office in March 2025, at which time she was started on Macrobid, and then cefuroxime.  Urine culture at the time demonstrated pansensitive E. coli.  She has continued to have urinary symptoms, potentially due to seeding of bacteria in the stones.  - Rocephin 1 g daily - Will discuss with urology obtaining intraoperative urine culture

## 2023-11-28 NOTE — ED Notes (Signed)
 Pt resting comfortably in bed at this time. Pt is alert and oriented with even and regular respirations. No acute distress noted. Pt denies any needs at this time. Family remains at bedside

## 2023-11-28 NOTE — ED Notes (Signed)
Patient ambulated to hallway bathroom with a steady gait. 

## 2023-11-28 NOTE — ED Provider Notes (Signed)
 [facility] Provider Note    Event Date/Time   First MD Initiated Contact with Patient 11/28/23 1302     (approximate)   History   Abdominal Pain   HPI  Hailey Pennington is a 38 y.o. female who presents with complaints of right lower quadrant abdominal pain.  Patient reports she has been seeing urology for UTI which does not seem to have fully resolved.  Denies fevers or chills.  Complains of increasing pain in her right lower quadrant however, referred to the ED for further evaluation.     Physical Exam   Triage Vital Signs: ED Triage Vitals  Encounter Vitals Group     BP 11/28/23 1245 118/73     Systolic BP Percentile --      Diastolic BP Percentile --      Pulse Rate 11/28/23 1245 94     Resp 11/28/23 1245 20     Temp 11/28/23 1245 98.3 F (36.8 C)     Temp Source 11/28/23 1245 Oral     SpO2 11/28/23 1245 100 %     Weight --      Height --      Head Circumference --      Peak Flow --      Pain Score 11/28/23 1246 7     Pain Loc --      Pain Education --      Exclude from Growth Chart --     Most recent vital signs: Vitals:   11/28/23 1453 11/28/23 1717  BP: 101/70 105/66  Pulse: 71 73  Resp: 16 16  Temp:  98.4 F (36.9 C)  SpO2: 96% 98%     General: Awake, no distress.  CV:  Good peripheral perfusion.  Resp:  Normal effort.  Abd:  No distention.  Tenderness in the right lower quadrant, no CVA tenderness Other:     ED Results / Procedures / Treatments   Labs (all labs ordered are listed, but only abnormal results are displayed) Labs Reviewed  COMPREHENSIVE METABOLIC PANEL WITH GFR - Abnormal; Notable for the following components:      Result Value   CO2 21 (*)    All other components within normal limits  URINALYSIS, ROUTINE W REFLEX MICROSCOPIC - Abnormal; Notable for the following components:   Color, Urine AMBER (*)    APPearance CLOUDY (*)    Hgb urine dipstick MODERATE (*)    Protein, ur 100 (*)     Leukocytes,Ua MODERATE (*)    Bacteria, UA MANY (*)    All other components within normal limits  LIPASE, BLOOD  CBC  PREGNANCY, URINE     EKG     RADIOLOGY     PROCEDURES:  Critical Care performed:   Procedures   MEDICATIONS ORDERED IN ED: Medications  ketorolac (TORADOL) 30 MG/ML injection 30 mg (30 mg Intravenous Given 11/28/23 1359)  cefTRIAXone (ROCEPHIN) 1 g in sodium chloride 0.9 % 100 mL IVPB (0 g Intravenous Stopped 11/28/23 1627)  iohexol (OMNIPAQUE) 300 MG/ML solution 100 mL (75 mLs Intravenous Contrast Given 11/28/23 1600)  morphine (PF) 4 MG/ML injection 4 mg (4 mg Intravenous Given 11/28/23 1736)  ondansetron (ZOFRAN) injection 4 mg (4 mg Intravenous Given 11/28/23 1733)     IMPRESSION / MDM / ASSESSMENT AND PLAN / ED COURSE  I reviewed the triage vital signs and the nursing notes. Patient's presentation is most consistent with acute presentation with potential threat to life or bodily function.  Patient  presents with right lower quad abdominal pain in the setting of recent UTI and still some dysuria.  Differential includes UTI, ureterolithiasis, appendicitis, less likely pyelonephritis  Will treat with IV Rocephin given continued UTI symptoms and failing outpatient treatment  Will obtain labs, urine pregnancy, IV Toradol, CT abdomen pelvis  CT scan demonstrates 2 stones in the ureter, given concern for possible UTI and obstructive distal ureteral stone, discussed with urology, he will see the patient in the emergency department, have discussed with hospitalist for admission      FINAL CLINICAL IMPRESSION(S) / ED DIAGNOSES   Final diagnoses:  Lower urinary tract infectious disease  Ureterolithiasis     Rx / DC Orders   ED Discharge Orders     None        Note:  This document was prepared using Dragon voice recognition software and may include unintentional dictation errors.   Bryson Carbine, MD 11/28/23 [phone]

## 2023-11-28 NOTE — ED Triage Notes (Signed)
 Pt to ED via POV from home. Pt ambulatory to triage. Pt reports dx with UTI 3/21 and has been on antibiotics. Pt reports now having RLQ pain and referred to ED by urologist. Pt denies N/V/D. Pt reports still has increased urinary urgency and irritation.

## 2023-11-29 ENCOUNTER — Encounter
Admission: EM | Disposition: A | Discharge status: Home / Self Care | Payer: Self-pay | Source: Home / Self Care | Attending: Emergency Medicine

## 2023-11-29 ENCOUNTER — Observation Stay: Admitting: Anesthesiology

## 2023-11-29 ENCOUNTER — Observation Stay

## 2023-11-29 ENCOUNTER — Encounter: Payer: Self-pay | Admitting: Internal Medicine

## 2023-11-29 DIAGNOSIS — N201 Calculus of ureter: Secondary | ICD-10-CM | POA: Diagnosis not present

## 2023-11-29 DIAGNOSIS — N200 Calculus of kidney: Secondary | ICD-10-CM | POA: Diagnosis not present

## 2023-11-29 DIAGNOSIS — N390 Urinary tract infection, site not specified: Secondary | ICD-10-CM | POA: Diagnosis not present

## 2023-11-29 HISTORY — PX: CYSTOSCOPY W/ URETERAL STENT PLACEMENT: SHX1429

## 2023-11-29 LAB — BASIC METABOLIC PANEL WITH GFR
Anion gap: 7 (ref 5–15)
BUN: 18 mg/dL (ref 6–20)
CO2: 26 mmol/L (ref 22–32)
Calcium: 8.8 mg/dL — ABNORMAL LOW (ref 8.9–10.3)
Chloride: 106 mmol/L (ref 98–111)
Creatinine, Ser: 0.77 mg/dL (ref 0.44–1.00)
GFR, Estimated: >60 mL/min (ref >60)
Glucose, Bld: 111 mg/dL — ABNORMAL HIGH (ref 70–99)
Potassium: 3.9 mmol/L (ref 3.5–5.1)
Sodium: 139 mmol/L (ref 135–145)

## 2023-11-29 LAB — CBC
HCT: 41.1 % (ref 36.0–46.0)
Hemoglobin: 13.7 g/dL (ref 12.0–15.0)
MCH: 29.1 pg (ref 26.0–34.0)
MCHC: 33.3 g/dL (ref 30.0–36.0)
MCV: 87.3 fL (ref 80.0–100.0)
Platelets: 178 10*3/uL (ref 150–400)
RBC: 4.71 MIL/uL (ref 3.87–5.11)
RDW: 13 % (ref 11.5–15.5)
WBC: 4.8 10*3/uL (ref 4.0–10.5)
nRBC: 0 % (ref 0.0–0.2)

## 2023-11-29 LAB — HIV ANTIBODY (ROUTINE TESTING W REFLEX): HIV Screen 4th Generation wRfx: NONREACTIVE

## 2023-11-29 SURGERY — CYSTOSCOPY, WITH RETROGRADE PYELOGRAM AND URETERAL STENT INSERTION
Anesthesia: General | Laterality: Right

## 2023-11-29 MED ORDER — LIDOCAINE HCL (CARDIAC) PF 100 MG/5ML IV SOSY
PREFILLED_SYRINGE | INTRAVENOUS | Status: DC | PRN
  Administered 2023-11-29: 60 mg via INTRAVENOUS

## 2023-11-29 MED ORDER — SODIUM CHLORIDE 0.9 % IR SOLN
Status: DC | PRN
  Administered 2023-11-29: 1500 mL

## 2023-11-29 MED ORDER — PHENYLEPHRINE 80 MCG/ML (10ML) SYRINGE FOR IV PUSH (FOR BLOOD PRESSURE SUPPORT)
PREFILLED_SYRINGE | INTRAVENOUS | Status: DC | PRN
  Administered 2023-11-29 (×2): 80 ug via INTRAVENOUS
  Administered 2023-11-29: 160 ug via INTRAVENOUS

## 2023-11-29 MED ORDER — ACETAMINOPHEN 10 MG/ML IV SOLN
INTRAVENOUS | Status: DC | PRN
  Administered 2023-11-29: 1000 mg via INTRAVENOUS

## 2023-11-29 MED ORDER — ACETAMINOPHEN 10 MG/ML IV SOLN
INTRAVENOUS | Status: AC
  Filled 2023-11-29: qty 100

## 2023-11-29 MED ORDER — FENTANYL CITRATE (PF) 100 MCG/2ML IJ SOLN
25.0000 ug | INTRAMUSCULAR | Status: DC | PRN
Start: 2023-11-29 — End: 2023-11-29

## 2023-11-29 MED ORDER — CHLORHEXIDINE GLUCONATE 0.12 % MT SOLN
15.0000 mL | Freq: Once | OROMUCOSAL | Status: AC
Start: 2023-11-29 — End: 2023-11-29
  Administered 2023-11-29: 15 mL via OROMUCOSAL

## 2023-11-29 MED ORDER — MIDAZOLAM HCL 2 MG/2ML IJ SOLN
INTRAMUSCULAR | Status: DC | PRN
  Administered 2023-11-29: 2 mg via INTRAVENOUS

## 2023-11-29 MED ORDER — OXYCODONE HCL 5 MG/5ML PO SOLN: 5.0000 mg | Freq: Once | ORAL | Status: DC | PRN

## 2023-11-29 MED ORDER — ACETAMINOPHEN 10 MG/ML IV SOLN: 1000.0000 mg | Freq: Once | INTRAVENOUS | Status: DC | PRN

## 2023-11-29 MED ORDER — SODIUM CHLORIDE 0.9 % IV BOLUS
500.0000 mL | Freq: Once | INTRAVENOUS | Status: AC
  Administered 2023-11-29: 500 mL via INTRAVENOUS

## 2023-11-29 MED ORDER — SODIUM CHLORIDE 0.9 % IV SOLN: INTRAVENOUS | Status: DC | PRN

## 2023-11-29 MED ORDER — DEXAMETHASONE SODIUM PHOSPHATE 10 MG/ML IJ SOLN
INTRAMUSCULAR | Status: DC | PRN
  Administered 2023-11-29: 5 mg via INTRAVENOUS

## 2023-11-29 MED ORDER — FENTANYL CITRATE (PF) 100 MCG/2ML IJ SOLN
INTRAMUSCULAR | Status: AC
  Filled 2023-11-29: qty 2

## 2023-11-29 MED ORDER — LIDOCAINE HCL (PF) 2 % IJ SOLN
INTRAMUSCULAR | Status: AC
  Filled 2023-11-29: qty 5

## 2023-11-29 MED ORDER — GLYCOPYRROLATE 0.2 MG/ML IJ SOLN
INTRAMUSCULAR | Status: DC | PRN
  Administered 2023-11-29: .2 mg via INTRAVENOUS

## 2023-11-29 MED ORDER — MIDAZOLAM HCL 2 MG/2ML IJ SOLN
INTRAMUSCULAR | Status: AC
  Filled 2023-11-29: qty 2

## 2023-11-29 MED ORDER — DROPERIDOL 2.5 MG/ML IJ SOLN: 0.6250 mg | Freq: Once | INTRAMUSCULAR | Status: DC | PRN

## 2023-11-29 MED ORDER — DEXTROSE 5 % IV SOLN
INTRAVENOUS | Status: DC | PRN
  Administered 2023-11-29: 1 g via INTRAVENOUS

## 2023-11-29 MED ORDER — PROPOFOL 10 MG/ML IV BOLUS
INTRAVENOUS | Status: DC | PRN
  Administered 2023-11-29: 200 mg via INTRAVENOUS

## 2023-11-29 MED ORDER — IOHEXOL 180 MG/ML  SOLN
INTRAMUSCULAR | Status: DC | PRN
  Administered 2023-11-29: 10 mL

## 2023-11-29 MED ORDER — OXYCODONE HCL 5 MG PO TABS: 5.0000 mg | ORAL_TABLET | Freq: Once | ORAL | Status: DC | PRN

## 2023-11-29 MED ORDER — STERILE WATER FOR IRRIGATION IR SOLN
Status: DC | PRN
Start: 2023-11-29 — End: 2023-11-29
  Administered 2023-11-29: 500 mL

## 2023-11-29 MED ORDER — PROPOFOL 10 MG/ML IV BOLUS
INTRAVENOUS | Status: AC
  Filled 2023-11-29: qty 20

## 2023-11-29 MED ORDER — EPHEDRINE SULFATE-NACL 50-0.9 MG/10ML-% IV SOSY
PREFILLED_SYRINGE | INTRAVENOUS | Status: DC | PRN
  Administered 2023-11-29: 10 mg via INTRAVENOUS

## 2023-11-29 MED ORDER — CHLORHEXIDINE GLUCONATE 0.12 % MT SOLN
OROMUCOSAL | Status: AC
  Filled 2023-11-29: qty 15

## 2023-11-29 SURGICAL SUPPLY — 18 items
BRUSH SCRUB EZ 4% CHG (MISCELLANEOUS) ×1 IMPLANT
CATH URETL OPEN 5X70 (CATHETERS) IMPLANT
CATH URETL OPEN END 6X70 (CATHETERS) ×1 IMPLANT
DRAPE C-ARM XRAY 36X54 (DRAPES) IMPLANT
GLOVE BIOGEL PI IND STRL 7.5 (GLOVE) ×1 IMPLANT
GOWN STRL REUS W/ TWL LRG LVL3 (GOWN DISPOSABLE) ×1 IMPLANT
GOWN STRL REUS W/ TWL XL LVL3 (GOWN DISPOSABLE) ×1 IMPLANT
GUIDEWIRE STR DUAL SENSOR (WIRE) ×1 IMPLANT
GUIDEWIRE STR ZIPWIRE 035X150 (MISCELLANEOUS) IMPLANT
KIT TURNOVER CYSTO (KITS) ×1 IMPLANT
PACK CYSTO AR (MISCELLANEOUS) ×1 IMPLANT
SET CYSTO W/LG BORE CLAMP LF (SET/KITS/TRAYS/PACK) ×1 IMPLANT
SOL .9 NS 3000ML IRR UROMATIC (IV SOLUTION) ×1 IMPLANT
STENT URET 6FRX24 CONTOUR (STENTS) IMPLANT
STENT URET 6FRX26 CONTOUR (STENTS) IMPLANT
SURGILUBE 2OZ TUBE FLIPTOP (MISCELLANEOUS) ×1 IMPLANT
SYR 10ML LL (SYRINGE) ×1 IMPLANT
WATER STERILE IRR 500ML POUR (IV SOLUTION) ×1 IMPLANT

## 2023-11-29 NOTE — Plan of Care (Signed)
       CROSS COVER NOTE  NAME: Hailey Pennington MRN: [id number] DOB : [birth year]    Concern as stated by nurse / staff   pts last bp 89/62. looks like they've been soft since she's been here but wanted to see if you wanted to do fluids or anything      Pertinent findings on chart review:    11/28/2023   11:35 PM 11/28/2023    8:00 PM 11/28/2023    7:47 PM  Vitals with BMI  Systolic 90 105 102  Diastolic 57 73 65  Pulse 58 27 5  38 year old female admitted for obstructive nephrolithiasis with plan for stent later today by urology.   Assessment and  Interventions   Assessment:  Soft blood pressure, currently n.p.o. for procedure.  No tachycardic response, suspect in part related to hydromorphone for pain   Plan: Gentle bolus given n.p.o. status X X

## 2023-11-29 NOTE — Op Note (Signed)
    Preoperative diagnosis:  Right nephrolithiasis Right distal ureteral calculus Urinary tract infection  Postoperative diagnosis:  Same  Procedure:  Cystoscopy Right ureteral stent placement (91F/24 cm)   Surgeon: Geralyn Knee C. Kairee Isa, M.D.  Anesthesia: General  Complications: None  Intraoperative findings:  Cystoscopy: Hyperemic bladder mucosa; no solid or papillary lesions Moderate right hydronephrosis with retained contrast from CT 16 hours prior  EBL: Minimal  Specimens: Urine right renal pelvis for culture  Indication: Hailey Pennington is a 38 y.o. female admitted with right lower quadrant abdominal pain and UTI.  CT remarkable for a 10 mm right UPJ calculus and 6 mm right distal ureteral calculus.  Refer to consult note/14/25 for details.  After reviewing the management options for treatment, he/she elected to proceed with the above surgical procedure(s). We have discussed the potential benefits and risks of the procedure, side effects of the proposed treatment, the likelihood of the patient achieving the goals of the procedure, and any potential problems that might occur during the procedure or recuperation. Informed consent has been obtained.  Description of procedure:  The patient was taken to the operating room and general anesthesia was induced.  The patient was placed in the dorsal lithotomy position, prepped and draped in the usual sterile fashion, and preoperative antibiotics were administered. A preoperative time-out was performed.   A 21 French cystoscope sheath with obturator was lubricated and passed per urethra.  A 30 degree lens was then placed and panendoscopy was performed with findings as described above.  Attention was directed to the right ureteral orifice and a 0.038 Sensor guidewire was then advanced into the right UO however could not be negotiated proximal to the distal ureteral calculus under C arm fluoroscopy.  The stone could be usually visualized.  A  75F open-ended ureteral catheter was then positioned at the right UO and a 0.038 ZIP wire was placed through the ureteral catheter and into the right UO.  This wire was negotiated proximal to the calculus.  It was easily advanced proximal to the UPJ calculus into the right renal pelvis under fluoroscopic guidance.  The ureteral catheter was advanced over the guidewire proximal to the UPJ calculus and 10 mL of slightly cloudy urine was aspirated and sent for culture.    A 91F/24 cm Contour ureteral stent was advanced over the guidewire.  The stent was positioned appropriately under fluoroscopic and cystoscopic guidance.  The wire was then removed with an adequate stent curl noted in the renal pelvis as well as in the bladder.  The bladder was then emptied and the procedure ended.  The patient appeared to tolerate the procedure well and without complications.  After anesthetic reversal the patient was transported to the PACU in stable condition.  Plan: She has been admitted to the hospitalist service for IV antibiotics Definitive stone treatment will be scheduled within the next 2 weeks   Darlynn Elam, MD

## 2023-11-29 NOTE — Transfer of Care (Signed)
 Immediate Anesthesia Transfer of Care Note  Patient: Jourden Delmont  Procedure(s) Performed: CYSTOSCOPY, WITH RETROGRADE PYELOGRAM AND URETERAL STENT INSERTION (Right)  Patient Location: PACU  Anesthesia Type:General  Level of Consciousness: awake, alert , and oriented  Airway & Oxygen Therapy: Patient Spontanous Breathing  Post-op Assessment: Report given to RN and Post -op Vital signs reviewed and stable  Post vital signs: stable  Last Vitals:  Vitals Value Taken Time  BP 106/56 11/29/23 1329  Temp    Pulse 92 11/29/23 1330  Resp 13 11/29/23 1330  SpO2 100 % 11/29/23 1330  Vitals shown include unfiled device data.  Last Pain:  Vitals:   11/29/23 1150  TempSrc: Temporal  PainSc:          Complications: No notable events documented.

## 2023-11-29 NOTE — Progress Notes (Signed)
  Progress Note   Patient: Hailey Pennington RUE:[id number] DOB: 09-28-1985 DOA: 11/28/2023     0 DOS: the patient was seen and examined on 11/29/2023   Assessment and Plan: Nephrolithiasis - NPO for urological intervention   Complicated UTI (urinary tract infection) - IV ceftriaxone 1 g daily  - IV dilaudid PRN  - IV toradol 15 mg q6 hr standing   Epilepsy, unspecified, not intractable, without status epilepticus (HCC) - Lamictal XR 300 mg PO daily   Subjective: Pt seen and examined at the bedside.She remains NPO pending urological intervention.  Physical Exam: Vitals:   11/29/23 0005 11/29/23 0202 11/29/23 0551 11/29/23 0600  BP:  94/65  99/74  Pulse:  82  75  Resp:  19  11  Temp: 97.7 F (36.5 C)  97.7 F (36.5 C)   TempSrc: Oral  Oral   SpO2:  100%  100%   Physical Exam HENT:     Head: Normocephalic.  Cardiovascular:     Rate and Rhythm: Normal rate and regular rhythm.  Pulmonary:     Effort: Pulmonary effort is normal.  Abdominal:     General: Abdomen is flat.     Palpations: Abdomen is soft.  Skin:    General: Skin is warm.  Neurological:     Mental Status: She is alert and oriented to person, place, and time.  Psychiatric:        Mood and Affect: Mood normal.     Disposition: Status is: Observation The patient remains OBS appropriate and will d/c before 2 midnights.  Planned Discharge Destination: Home    Time spent: 35 minutes  Author: Charlyn Vialpando , MD 11/29/2023 9:11 AM  For on call review [email or web address].

## 2023-11-29 NOTE — Anesthesia Procedure Notes (Signed)
 Procedure Name: LMA Insertion Date/Time: 11/29/2023 12:37 PM  Performed by: Niki Barter, CRNAPre-anesthesia Checklist: Patient identified, Emergency Drugs available, Suction available and Patient being monitored Patient Re-evaluated:Patient Re-evaluated prior to induction Oxygen Delivery Method: Circle system utilized Preoxygenation: Pre-oxygenation with 100% oxygen Induction Type: IV induction Ventilation: Mask ventilation without difficulty LMA: LMA inserted LMA Size: 3.0 Number of attempts: 1 Placement Confirmation: positive ETCO2 and breath sounds checked- equal and bilateral Dental Injury: Teeth and Oropharynx as per pre-operative assessment

## 2023-11-29 NOTE — H&P (View-Only) (Signed)
 Urology Consult  I have been asked to see the patient by Dr. Huel Cote, for evaluation and management of nephrolithiasis.  Chief Complaint: RLQ pain, urinary urgency  History of Present Illness: Hailey Pennington is a 38 y.o. year old female admitted yesterday with a 10 mm right UPJ stone and 6 mm distal right ureteral stone with complicated UTI.  White count has remained WNL, now 4.8.  Creatinine rather stable, now 0.77.  She has been afebrile, VSS.  Admission UA appeared grossly positive with >50 RBC/hpf, >50 WBC/hpf, and many bacteria, though it was contaminated with 11-20 squamous epithelial cells/hpf.  She had a recent positive urine culture on 11/05/2023 that grew pansensitive E. coli.  She has been started on antibiotics as below.  She reports a family history of nephrolithiasis. No prior stone episodes, no history of IBD. She used Depo-Provera for about 10 years and has had recurrent UTIs since discontinuing.  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  Anti-infectives (From admission, onward)    Start     Dose/Rate Route Frequency Ordered Stop   11/29/23 1400  cefTRIAXone (ROCEPHIN) 1 g in sodium chloride 0.9 % 100 mL IVPB        1 g 200 mL/hr over 30 Minutes Intravenous Every 24 hours 11/28/23 1947     11/28/23 1415  cefTRIAXone (ROCEPHIN) 1 g in sodium chloride 0.9 % 100 mL IVPB        1 g 200 mL/hr over 30 Minutes Intravenous  Once 11/28/23 1408 11/28/23 1627        Past Medical History:  Diagnosis Date   Herpes    History of HPV infection    Seizures (HCC)    Syringomyelia (HCC)     Past Surgical History:  Procedure Laterality Date   CESAREAN SECTION     WISDOM TOOTH EXTRACTION      Home Medications:  Current Meds  Medication Sig   folic acid (FOLVITE) 1 MG tablet Take 2 tablets (2 mg total) by mouth 2 (two) times daily.   LamoTRIgine 300 MG TB24 24 hour tablet Take by mouth.   nitrofurantoin (MACRODANTIN) 50 MG capsule 1  capsule p.o. after intercourse.  Do not start until current antibiotic course completed   ondansetron (ZOFRAN-ODT) 4 MG disintegrating tablet Take 1 tablet (4 mg total) by mouth every 8 (eight) hours as needed.   valACYclovir (VALTREX) 500 MG tablet Take 500 mg by mouth 2 (two) times daily.    Allergies: No Known Allergies  Family History  Problem Relation Age of Onset   Other Mother        precancerous cells in uterine lining   Endometriosis Mother    Testicular cancer Maternal Uncle    Endometriosis Maternal Grandmother    Uterine cancer Maternal Grandmother        late years   Pancreatic cancer Maternal Grandfather        57   Colon cancer Paternal Grandfather    Cancer Paternal Grandfather        eye    Social History:  reports that she has been smoking. She has never used smokeless tobacco. She reports current alcohol use. She reports that she does not use drugs.  ROS: A complete review of systems was performed.  All systems are negative except for pertinent findings as noted.  Physical Exam:  Vital signs in last 24 hours: Temp:  [97.7 F (36.5 C)-98.4 F (36.9 C)] 97.7 F (36.5 C) (04/14 0551)  Pulse Rate:  [58-94] 87 (04/14 0910) Resp:  [8-20] 16 (04/14 0910) BP: (90-118)/(57-74) 111/69 (04/14 0910) SpO2:  [95 %-100 %] 100 % (04/14 0910) Constitutional:  Alert and oriented, no acute distress HEENT: [place] AT, moist mucus membranes Cardiovascular: No clubbing, cyanosis, or edema Respiratory: Normal respiratory effort Skin: No rashes, bruises or suspicious lesions Neurologic: Grossly intact, no focal deficits, moving all 4 extremities Psychiatric: Normal mood and affect  Laboratory Data:  Recent Labs    11/28/23 1247 11/29/23 0550  WBC 5.5 4.8  HGB 13.5 13.7  HCT 41.1 41.1   Recent Labs    11/28/23 1247 11/29/23 0550  NA 136 139  K 3.9 3.9  CL 105 106  CO2 21* 26  GLUCOSE 93 111*  BUN 16 18  CREATININE 0.59 0.77  CALCIUM 8.9 8.8*   Urinalysis     Component Value Date/Time   COLORURINE AMBER (A) 11/28/2023 1247   APPEARANCEUR CLOUDY (A) 11/28/2023 1247   APPEARANCEUR Cloudy (A) 11/05/2023 1144   LABSPEC 1.028 11/28/2023 1247   PHURINE 5.0 11/28/2023 1247   GLUCOSEU NEGATIVE 11/28/2023 1247   HGBUR MODERATE (A) 11/28/2023 1247   BILIRUBINUR NEGATIVE 11/28/2023 1247   BILIRUBINUR Negative 11/05/2023 1144   KETONESUR NEGATIVE 11/28/2023 1247   PROTEINUR 100 (A) 11/28/2023 1247   UROBILINOGEN negative (A) 06/18/2023 0958   NITRITE NEGATIVE 11/28/2023 1247   LEUKOCYTESUR MODERATE (A) 11/28/2023 1247   Results for orders placed or performed in visit on 11/05/23  Microscopic Examination     Status: Abnormal   Collection Time: 11/05/23 11:44 AM   Urine  Result Value Ref Range Status   WBC, UA >30 (A) 0 - 5 /hpf Final   RBC, Urine 3-10 (A) 0 - 2 /hpf Final   Epithelial Cells (non renal) 0-10 0 - 10 /hpf Final   Bacteria, UA Many (A) None seen/Few Final  CULTURE, URINE COMPREHENSIVE     Status: Abnormal   Collection Time: 11/05/23 12:11 PM   Specimen: Urine   UR  Result Value Ref Range Status   Urine Culture, Comprehensive Final report (A)  Final   Organism ID, Bacteria Escherichia coli (A)  Final    Comment: Cefazolin with an MIC <=16 predicts susceptibility to the oral agents cefaclor, cefdinir, cefpodoxime, cefprozil, cefuroxime, cephalexin, and loracarbef when used for therapy of uncomplicated urinary tract infections due to E. coli, Klebsiella pneumoniae, and Proteus mirabilis. Greater than 100,000 colony forming units per mL    Organism ID, Bacteria Comment  Final    Comment: Mixed urogenital flora 10,000-25,000 colony forming units per mL    ANTIMICROBIAL SUSCEPTIBILITY Comment  Final    Comment:       ** S = Susceptible; I = Intermediate; R = Resistant **                    P = Positive; N = Negative             MICS are expressed in micrograms per mL    Antibiotic                 RSLT#1    RSLT#2    RSLT#3     RSLT#4 Amoxicillin/Clavulanic Acid    S Ampicillin                     S Cefazolin  S Cefepime                       S Cefoxitin                      S Cefpodoxime                    S Ceftriaxone                    S Ciprofloxacin                  S Ertapenem                      S Gentamicin                     S Levofloxacin                   S Meropenem                      S Nitrofurantoin                 S Piperacillin/Tazobactam        S Tetracycline                   S Tobramycin                     S Trimethoprim/Sulfa             S     Radiologic Imaging: CT ABDOMEN PELVIS W CONTRAST Result Date: 11/28/2023 CLINICAL DATA:  RIGHT lower quadrant abdominal pain. EXAM: CT ABDOMEN AND PELVIS WITH CONTRAST TECHNIQUE: Multidetector CT imaging of the abdomen and pelvis was performed using the standard protocol following bolus administration of intravenous contrast. RADIATION DOSE REDUCTION: This exam was performed according to the departmental dose-optimization program which includes automated exposure control, adjustment of the mA and/or kV according to patient size and/or use of iterative reconstruction technique. CONTRAST:  75mL OMNIPAQUE IOHEXOL 300 MG/ML  SOLN COMPARISON:  CT abdomen pelvis 10/29/2022 FINDINGS: Lower chest: Lung bases are clear. Hepatobiliary: No focal hepatic lesion. Normal gallbladder. No biliary duct dilatation. Common bile duct is normal. Pancreas: Pancreas is normal. No ductal dilatation. No pancreatic inflammation. Spleen: Normal spleen Adrenals/urinary tract: Adrenal glands normal. There is hydronephrosis of the RIGHT renal collecting system. There is a partially obstructing calculus at the RIGHT ureteropelvic junction measuring 10 mm (image 29/3) distal to the RIGHT ureteropelvic junction calculus the ureter remains dilated into the pelvis. There is a second obstructing calculus in the distal RIGHT ureter measuring 6 mm on image 66/3. This  distal RIGHT ureteral calculus is within 1 cm of the RIGHT vesicoureteral junction. Single nonobstructing calculus in the upper pole of the LEFT kidney. Stomach/Bowel: Stomach, small bowel, appendix, and cecum are normal. The colon and rectosigmoid colon are normal. Vascular/Lymphatic: Abdominal aorta is normal caliber. No periportal or retroperitoneal adenopathy. No pelvic adenopathy. Reproductive: Uterus and ovaries are normal. There are prominent parametrial veins along the LEFT margin of the uterus which drain into the LEFT gonadal vein. Other: No free fluid. Musculoskeletal: No aggressive osseous lesion. IMPRESSION: 1. Partially obstructing calculus at the RIGHT ureteropelvic junction. RIGHT hydronephrosis. 2. Obstructing calculus in the distal RIGHT ureter. RIGHT hydroureter. 3. Nonobstructing calculus in the upper pole of the LEFT kidney. 4.  Normal appendix. 5. Prominent parametrial veins along the LEFT margin of the uterus which drain into the LEFT gonadal vein. Findings can be seen in the setting of pelvic congestion syndrome. Electronically Signed   By: Deboraha Fallow M.D.   On: 11/28/2023 16:49   Assessment & Plan:  38 year old female with PMH recurrent UTI admitted with complicated UTI secondary to a 10 mm right UPJ stone and 6 mm distal right ureteral stone.  We discussed that in light of her recent/possible current UTI, we recommended right ureteral stent placement for urinary decompression to allow for sterilization of the urine prior to treating the stones.  I offered her right ureteral stent placement today versus tomorrow with Dr. Cherylene Corrente and she prefers sooner rather than later so she can get back to her coursework.  We discussed common stent symptoms including flank pain, bladder pain, dysuria, urgency, frequency, and gross hematuria.  We discussed that she will require a total of about 7 days of culture appropriate antibiotics and we will plan for outpatient right ureteroscopy with  laser lithotripsy and stent exchange for definitive management of her stones in about 2 weeks.  She is in agreement with this plan.  I will plan to start her on topical vaginal estrogen cream after definitive stone management to help with her recurrent UTIs, with long-term hormonal contraception likely contributing to a GSM-like picture.  Recommendations: -Keep n.p.o. in advance of procedure, sips with meds okay - Right ureteral stent placement with Dr. Cherylene Corrente today, informed consent order in - If vitals remain stable, okay for discharge from the PACU from the urologic perspective  Thank you for involving me in this patient's care, I will continue to follow along.  Brennyn Haisley, PA-C 11/29/2023 10:06 AM

## 2023-11-29 NOTE — Consult Note (Addendum)
 Urology Consult  I have been asked to see the patient by Dr. Huel Cote, for evaluation and management of nephrolithiasis.  Chief Complaint: RLQ pain, urinary urgency  History of Present Illness: Hailey Pennington is a 38 y.o. year old female admitted yesterday with a 10 mm right UPJ stone and 6 mm distal right ureteral stone with complicated UTI.  White count has remained WNL, now 4.8.  Creatinine rather stable, now 0.77.  She has been afebrile, VSS.  Admission UA appeared grossly positive with >50 RBC/hpf, >50 WBC/hpf, and many bacteria, though it was contaminated with 11-20 squamous epithelial cells/hpf.  She had a recent positive urine culture on 11/05/2023 that grew pansensitive E. coli.  She has been started on antibiotics as below.  She reports a family history of nephrolithiasis. No prior stone episodes, no history of IBD. She used Depo-Provera for about 10 years and has had recurrent UTIs since discontinuing.  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  Anti-infectives (From admission, onward)    Start     Dose/Rate Route Frequency Ordered Stop   11/29/23 1400  cefTRIAXone (ROCEPHIN) 1 g in sodium chloride 0.9 % 100 mL IVPB        1 g 200 mL/hr over 30 Minutes Intravenous Every 24 hours 11/28/23 1947     11/28/23 1415  cefTRIAXone (ROCEPHIN) 1 g in sodium chloride 0.9 % 100 mL IVPB        1 g 200 mL/hr over 30 Minutes Intravenous  Once 11/28/23 1408 11/28/23 1627        Past Medical History:  Diagnosis Date   Herpes    History of HPV infection    Seizures (HCC)    Syringomyelia (HCC)     Past Surgical History:  Procedure Laterality Date   CESAREAN SECTION     WISDOM TOOTH EXTRACTION      Home Medications:  Current Meds  Medication Sig   folic acid (FOLVITE) 1 MG tablet Take 2 tablets (2 mg total) by mouth 2 (two) times daily.   LamoTRIgine 300 MG TB24 24 hour tablet Take by mouth.   nitrofurantoin (MACRODANTIN) 50 MG capsule 1  capsule p.o. after intercourse.  Do not start until current antibiotic course completed   ondansetron (ZOFRAN-ODT) 4 MG disintegrating tablet Take 1 tablet (4 mg total) by mouth every 8 (eight) hours as needed.   valACYclovir (VALTREX) 500 MG tablet Take 500 mg by mouth 2 (two) times daily.    Allergies: No Known Allergies  Family History  Problem Relation Age of Onset   Other Mother        precancerous cells in uterine lining   Endometriosis Mother    Testicular cancer Maternal Uncle    Endometriosis Maternal Grandmother    Uterine cancer Maternal Grandmother        late years   Pancreatic cancer Maternal Grandfather        57   Colon cancer Paternal Grandfather    Cancer Paternal Grandfather        eye    Social History:  reports that she has been smoking. She has never used smokeless tobacco. She reports current alcohol use. She reports that she does not use drugs.  ROS: A complete review of systems was performed.  All systems are negative except for pertinent findings as noted.  Physical Exam:  Vital signs in last 24 hours: Temp:  [97.7 F (36.5 C)-98.4 F (36.9 C)] 97.7 F (36.5 C) (04/14 0551)  Pulse Rate:  [58-94] 87 (04/14 0910) Resp:  [8-20] 16 (04/14 0910) BP: (90-118)/(57-74) 111/69 (04/14 0910) SpO2:  [95 %-100 %] 100 % (04/14 0910) Constitutional:  Alert and oriented, no acute distress HEENT: [place] AT, moist mucus membranes Cardiovascular: No clubbing, cyanosis, or edema Respiratory: Normal respiratory effort Skin: No rashes, bruises or suspicious lesions Neurologic: Grossly intact, no focal deficits, moving all 4 extremities Psychiatric: Normal mood and affect  Laboratory Data:  Recent Labs    11/28/23 1247 11/29/23 0550  WBC 5.5 4.8  HGB 13.5 13.7  HCT 41.1 41.1   Recent Labs    11/28/23 1247 11/29/23 0550  NA 136 139  K 3.9 3.9  CL 105 106  CO2 21* 26  GLUCOSE 93 111*  BUN 16 18  CREATININE 0.59 0.77  CALCIUM 8.9 8.8*   Urinalysis     Component Value Date/Time   COLORURINE AMBER (A) 11/28/2023 1247   APPEARANCEUR CLOUDY (A) 11/28/2023 1247   APPEARANCEUR Cloudy (A) 11/05/2023 1144   LABSPEC 1.028 11/28/2023 1247   PHURINE 5.0 11/28/2023 1247   GLUCOSEU NEGATIVE 11/28/2023 1247   HGBUR MODERATE (A) 11/28/2023 1247   BILIRUBINUR NEGATIVE 11/28/2023 1247   BILIRUBINUR Negative 11/05/2023 1144   KETONESUR NEGATIVE 11/28/2023 1247   PROTEINUR 100 (A) 11/28/2023 1247   UROBILINOGEN negative (A) 06/18/2023 0958   NITRITE NEGATIVE 11/28/2023 1247   LEUKOCYTESUR MODERATE (A) 11/28/2023 1247   Results for orders placed or performed in visit on 11/05/23  Microscopic Examination     Status: Abnormal   Collection Time: 11/05/23 11:44 AM   Urine  Result Value Ref Range Status   WBC, UA >30 (A) 0 - 5 /hpf Final   RBC, Urine 3-10 (A) 0 - 2 /hpf Final   Epithelial Cells (non renal) 0-10 0 - 10 /hpf Final   Bacteria, UA Many (A) None seen/Few Final  CULTURE, URINE COMPREHENSIVE     Status: Abnormal   Collection Time: 11/05/23 12:11 PM   Specimen: Urine   UR  Result Value Ref Range Status   Urine Culture, Comprehensive Final report (A)  Final   Organism ID, Bacteria Escherichia coli (A)  Final    Comment: Cefazolin with an MIC <=16 predicts susceptibility to the oral agents cefaclor, cefdinir, cefpodoxime, cefprozil, cefuroxime, cephalexin, and loracarbef when used for therapy of uncomplicated urinary tract infections due to E. coli, Klebsiella pneumoniae, and Proteus mirabilis. Greater than 100,000 colony forming units per mL    Organism ID, Bacteria Comment  Final    Comment: Mixed urogenital flora 10,000-25,000 colony forming units per mL    ANTIMICROBIAL SUSCEPTIBILITY Comment  Final    Comment:       ** S = Susceptible; I = Intermediate; R = Resistant **                    P = Positive; N = Negative             MICS are expressed in micrograms per mL    Antibiotic                 RSLT#1    RSLT#2    RSLT#3     RSLT#4 Amoxicillin/Clavulanic Acid    S Ampicillin                     S Cefazolin  S Cefepime                       S Cefoxitin                      S Cefpodoxime                    S Ceftriaxone                    S Ciprofloxacin                  S Ertapenem                      S Gentamicin                     S Levofloxacin                   S Meropenem                      S Nitrofurantoin                 S Piperacillin/Tazobactam        S Tetracycline                   S Tobramycin                     S Trimethoprim/Sulfa             S     Radiologic Imaging: CT ABDOMEN PELVIS W CONTRAST Result Date: 11/28/2023 CLINICAL DATA:  RIGHT lower quadrant abdominal pain. EXAM: CT ABDOMEN AND PELVIS WITH CONTRAST TECHNIQUE: Multidetector CT imaging of the abdomen and pelvis was performed using the standard protocol following bolus administration of intravenous contrast. RADIATION DOSE REDUCTION: This exam was performed according to the departmental dose-optimization program which includes automated exposure control, adjustment of the mA and/or kV according to patient size and/or use of iterative reconstruction technique. CONTRAST:  75mL OMNIPAQUE IOHEXOL 300 MG/ML  SOLN COMPARISON:  CT abdomen pelvis 10/29/2022 FINDINGS: Lower chest: Lung bases are clear. Hepatobiliary: No focal hepatic lesion. Normal gallbladder. No biliary duct dilatation. Common bile duct is normal. Pancreas: Pancreas is normal. No ductal dilatation. No pancreatic inflammation. Spleen: Normal spleen Adrenals/urinary tract: Adrenal glands normal. There is hydronephrosis of the RIGHT renal collecting system. There is a partially obstructing calculus at the RIGHT ureteropelvic junction measuring 10 mm (image 29/3) distal to the RIGHT ureteropelvic junction calculus the ureter remains dilated into the pelvis. There is a second obstructing calculus in the distal RIGHT ureter measuring 6 mm on image 66/3. This  distal RIGHT ureteral calculus is within 1 cm of the RIGHT vesicoureteral junction. Single nonobstructing calculus in the upper pole of the LEFT kidney. Stomach/Bowel: Stomach, small bowel, appendix, and cecum are normal. The colon and rectosigmoid colon are normal. Vascular/Lymphatic: Abdominal aorta is normal caliber. No periportal or retroperitoneal adenopathy. No pelvic adenopathy. Reproductive: Uterus and ovaries are normal. There are prominent parametrial veins along the LEFT margin of the uterus which drain into the LEFT gonadal vein. Other: No free fluid. Musculoskeletal: No aggressive osseous lesion. IMPRESSION: 1. Partially obstructing calculus at the RIGHT ureteropelvic junction. RIGHT hydronephrosis. 2. Obstructing calculus in the distal RIGHT ureter. RIGHT hydroureter. 3. Nonobstructing calculus in the upper pole of the LEFT kidney. 4.  Normal appendix. 5. Prominent parametrial veins along the LEFT margin of the uterus which drain into the LEFT gonadal vein. Findings can be seen in the setting of pelvic congestion syndrome. Electronically Signed   By: Deboraha Fallow M.D.   On: 11/28/2023 16:49   Assessment & Plan:  38 year old female with PMH recurrent UTI admitted with complicated UTI secondary to a 10 mm right UPJ stone and 6 mm distal right ureteral stone.  We discussed that in light of her recent/possible current UTI, we recommended right ureteral stent placement for urinary decompression to allow for sterilization of the urine prior to treating the stones.  I offered her right ureteral stent placement today versus tomorrow with Dr. Cherylene Corrente and she prefers sooner rather than later so she can get back to her coursework.  We discussed common stent symptoms including flank pain, bladder pain, dysuria, urgency, frequency, and gross hematuria.  We discussed that she will require a total of about 7 days of culture appropriate antibiotics and we will plan for outpatient right ureteroscopy with  laser lithotripsy and stent exchange for definitive management of her stones in about 2 weeks.  She is in agreement with this plan.  I will plan to start her on topical vaginal estrogen cream after definitive stone management to help with her recurrent UTIs, with long-term hormonal contraception likely contributing to a GSM-like picture.  Recommendations: -Keep n.p.o. in advance of procedure, sips with meds okay - Right ureteral stent placement with Dr. Cherylene Corrente today, informed consent order in - If vitals remain stable, okay for discharge from the PACU from the urologic perspective  Thank you for involving me in this patient's care, I will continue to follow along.  Brennyn Haisley, PA-C 11/29/2023 10:06 AM

## 2023-11-29 NOTE — Anesthesia Preprocedure Evaluation (Signed)
 Anesthesia Evaluation  Patient identified by MRN, date of birth, ID band Patient awake    Reviewed: Allergy & Precautions, H&P , NPO status , Patient's Chart, lab work & pertinent test results  Airway Mallampati: II  TM Distance: >3 FB Neck ROM: full    Dental no notable dental hx.    Pulmonary neg pulmonary ROS, Current Smoker and Patient abstained from smoking.   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)  negative psych ROS   GI/Hepatic negative GI ROS, Neg liver ROS,,,  Endo/Other  Pituitary microadenoma  Renal/GU      Musculoskeletal   Abdominal   Peds  Hematology negative hematology ROS (+)   Anesthesia Other Findings Rigth ureteral calculus UTI   Past Medical History: No date: Herpes No date: History of HPV infection No date: Seizures (HCC) No date: Syringomyelia (HCC)  Past Surgical History: No date: CESAREAN SECTION No date: WISDOM TOOTH EXTRACTION  BMI    Body Mass Index: 20.78 kg/m      Reproductive/Obstetrics negative OB ROS                              Anesthesia Physical Anesthesia Plan  ASA: 2  Anesthesia Plan: General LMA   Post-op Pain Management: Toradol IV (intra-op)* and Ofirmev IV (intra-op)*   Induction: Intravenous  PONV Risk Score and Plan: 3 and Dexamethasone, Ondansetron, Midazolam and Treatment may vary due to age or medical condition  Airway Management Planned: LMA  Additional Equipment:   Intra-op Plan:   Post-operative Plan: Extubation in OR  Informed Consent: I have reviewed the patients History and Physical, chart, labs and discussed the procedure including the risks, benefits and alternatives for the proposed anesthesia with the patient or authorized  representative who has indicated his/her understanding and acceptance.     Dental Advisory Given  Plan Discussed with: Anesthesiologist, CRNA and Surgeon  Anesthesia Plan Comments:          Anesthesia Quick Evaluation

## 2023-11-29 NOTE — Anesthesia Procedure Notes (Signed)
 Procedure Name: LMA Insertion Date/Time: 11/29/2023 12:37 PM  Performed by: Wilkins Hardy I, CRNAPre-anesthesia Checklist: Patient identified, Patient being monitored, Timeout performed, Emergency Drugs available and Suction available Patient Re-evaluated:Patient Re-evaluated prior to induction Oxygen Delivery Method: Circle system utilized Preoxygenation: Pre-oxygenation with 100% oxygen Induction Type: IV induction Ventilation: Mask ventilation without difficulty LMA: LMA inserted LMA Size: 3.0 Tube type: Oral Number of attempts: 1 Placement Confirmation: positive ETCO2 and breath sounds checked- equal and bilateral Tube secured with: Tape Dental Injury: Teeth and Oropharynx as per pre-operative assessment

## 2023-11-29 NOTE — ED Notes (Signed)
 Pt given eye mask. Denies any other needs at this time

## 2023-11-29 NOTE — ED Notes (Signed)
 Pt given warm blanket.

## 2023-11-29 NOTE — Progress Notes (Signed)
 Patient to OR vis transport and bed

## 2023-11-30 ENCOUNTER — Other Ambulatory Visit: Payer: Self-pay | Admitting: Physician Assistant

## 2023-11-30 ENCOUNTER — Other Ambulatory Visit: Payer: Self-pay

## 2023-11-30 ENCOUNTER — Telehealth: Payer: Self-pay

## 2023-11-30 ENCOUNTER — Encounter: Payer: Self-pay | Admitting: Urology

## 2023-11-30 ENCOUNTER — Encounter: Payer: Self-pay | Admitting: Physician Assistant

## 2023-11-30 DIAGNOSIS — T83592A Infection and inflammatory reaction due to indwelling ureteral stent, initial encounter: Secondary | ICD-10-CM | POA: Diagnosis not present

## 2023-11-30 DIAGNOSIS — N390 Urinary tract infection, site not specified: Secondary | ICD-10-CM | POA: Diagnosis not present

## 2023-11-30 DIAGNOSIS — N201 Calculus of ureter: Secondary | ICD-10-CM

## 2023-11-30 DIAGNOSIS — N200 Calculus of kidney: Secondary | ICD-10-CM | POA: Diagnosis not present

## 2023-11-30 LAB — CBC
HCT: 40.4 % (ref 36.0–46.0)
Hemoglobin: 13.4 g/dL (ref 12.0–15.0)
MCH: 29.5 pg (ref 26.0–34.0)
MCHC: 33.2 g/dL (ref 30.0–36.0)
MCV: 88.8 fL (ref 80.0–100.0)
Platelets: 207 10*3/uL (ref 150–400)
RBC: 4.55 MIL/uL (ref 3.87–5.11)
RDW: 12.7 % (ref 11.5–15.5)
WBC: 8.6 10*3/uL (ref 4.0–10.5)
nRBC: 0 % (ref 0.0–0.2)

## 2023-11-30 LAB — URINE CULTURE
Culture: <10000 — AB
Culture: NO GROWTH

## 2023-11-30 LAB — COMPREHENSIVE METABOLIC PANEL WITH GFR
ALT: 19 U/L (ref 0–44)
AST: 20 U/L (ref 15–41)
Albumin: 4.1 g/dL (ref 3.5–5.0)
Alkaline Phosphatase: 42 U/L (ref 38–126)
Anion gap: 7 (ref 5–15)
BUN: 18 mg/dL (ref 6–20)
CO2: 22 mmol/L (ref 22–32)
Calcium: 8.8 mg/dL — ABNORMAL LOW (ref 8.9–10.3)
Chloride: 106 mmol/L (ref 98–111)
Creatinine, Ser: 0.77 mg/dL (ref 0.44–1.00)
GFR, Estimated: >60 mL/min (ref >60)
Glucose, Bld: 113 mg/dL — ABNORMAL HIGH (ref 70–99)
Potassium: 4.1 mmol/L (ref 3.5–5.1)
Sodium: 135 mmol/L (ref 135–145)
Total Bilirubin: 0.8 mg/dL (ref 0.0–1.2)
Total Protein: 7.1 g/dL (ref 6.5–8.1)

## 2023-11-30 LAB — MAGNESIUM: Magnesium: 2.2 mg/dL (ref 1.7–2.4)

## 2023-11-30 MED ORDER — OXYBUTYNIN CHLORIDE 5 MG PO TABS: 5.0000 mg | ORAL_TABLET | Freq: Three times a day (TID) | ORAL | 0 refills | Status: DC | PRN

## 2023-11-30 MED ORDER — TAMSULOSIN HCL 0.4 MG PO CAPS
0.4000 mg | ORAL_CAPSULE | Freq: Every day | ORAL | Status: DC
  Administered 2023-11-30: 0.4 mg via ORAL
  Filled 2023-11-30: qty 1

## 2023-11-30 MED ORDER — SODIUM CHLORIDE 0.9 % IV SOLN
1.0000 g | Freq: Once | INTRAVENOUS | Status: DC
  Filled 2023-11-30: qty 10

## 2023-11-30 MED ORDER — CIPROFLOXACIN HCL 500 MG PO TABS: 500.0000 mg | ORAL_TABLET | Freq: Two times a day (BID) | ORAL | 0 refills | Status: DC

## 2023-11-30 MED ORDER — TAMSULOSIN HCL 0.4 MG PO CAPS: 0.4000 mg | ORAL_CAPSULE | Freq: Every day | ORAL | 0 refills | Status: AC

## 2023-11-30 MED ORDER — OXYBUTYNIN CHLORIDE 5 MG PO TABS: 5.0000 mg | ORAL_TABLET | Freq: Three times a day (TID) | ORAL | Status: DC | PRN

## 2023-11-30 MED ORDER — SIMETHICONE 40 MG/0.6ML PO SUSP
80.0000 mg | Freq: Four times a day (QID) | ORAL | Status: DC | PRN
  Filled 2023-11-30: qty 1.2

## 2023-11-30 MED ORDER — SIMETHICONE 40 MG/0.6ML PO SUSP
80.0000 mg | Freq: Four times a day (QID) | ORAL | Status: DC | PRN
  Administered 2023-11-30: 80 mg via ORAL
  Filled 2023-11-30: qty 30
  Filled 2023-11-30: qty 1.2

## 2023-11-30 MED ORDER — ONDANSETRON 4 MG PO TBDP: 4.0000 mg | ORAL_TABLET | Freq: Three times a day (TID) | ORAL | 0 refills | Status: AC | PRN

## 2023-11-30 MED ORDER — OXYCODONE HCL 5 MG PO TABS
5.0000 mg | ORAL_TABLET | Freq: Four times a day (QID) | ORAL | 0 refills | Status: DC | PRN
Start: 2023-11-30 — End: 2024-02-11

## 2023-11-30 MED ORDER — OXYCODONE HCL 5 MG PO TABS
5.0000 mg | ORAL_TABLET | Freq: Once | ORAL | Status: AC
  Administered 2023-11-30: 5 mg via ORAL
  Filled 2023-11-30: qty 1

## 2023-11-30 MED ORDER — CIPROFLOXACIN HCL 500 MG PO TABS
500.0000 mg | ORAL_TABLET | Freq: Two times a day (BID) | ORAL | Status: DC
  Administered 2023-11-30: 500 mg via ORAL
  Filled 2023-11-30 (×2): qty 1

## 2023-11-30 NOTE — Anesthesia Postprocedure Evaluation (Signed)
 Anesthesia Post Note  Patient: Hailey Pennington  Procedure(s) Performed: CYSTOSCOPY, WITH RETROGRADE PYELOGRAM AND URETERAL STENT INSERTION (Right)  Patient location during evaluation: PACU Anesthesia Type: General Level of consciousness: awake and alert Pain management: pain level controlled Vital Signs Assessment: post-procedure vital signs reviewed and stable Respiratory status: spontaneous breathing, nonlabored ventilation and respiratory function stable Cardiovascular status: blood pressure returned to baseline and stable Postop Assessment: no apparent nausea or vomiting Anesthetic complications: no   No notable events documented.   Last Vitals:  Vitals:   11/29/23 2316 11/30/23 0322  BP: 91/61 103/61  Pulse: 73 (!) 56  Resp: 18 16  Temp: 36.9 C 36.7 C  SpO2: 98% 100%    Last Pain:  Vitals:   11/30/23 0322  TempSrc: Oral  PainSc:                  Baltazar Bonier

## 2023-11-30 NOTE — Progress Notes (Signed)
 Patient discharged. Discharge instructions given. Patient verbalizes understanding. Transported by axillary.

## 2023-11-30 NOTE — Discharge Summary (Signed)
 Physician Discharge Summary   Patient: Hailey Pennington MRN: [id number] DOB: [birth year]  Admit date:     11/28/2023  Discharge date: 11/30/23  Discharge Physician: Baron Hamper    PCP: Alm Bustard, NP     Discharge Diagnoses: Principal Problem:   Nephrolithiasis Active Problems:   Complicated UTI (urinary tract infection)   Epilepsy, unspecified, not intractable, without status epilepticus (HCC)  Resolved Problems:   * No resolved hospital problems. *  Hospital Course: 38 yo F evaluated for nephrolithiasis and complicated UTI.  Urology was consulted. Pt was started on IV ceftriaxone. Urology took the pt to the OR on 11/29/2023 and placed a stent.  Pt tolerated the procedure well. Pt was kept in the hospital overnight for close observation. Pt will go home with scripts for PO ciprofloxacin and oxycododone/zofran. She understands she has to follow up with urology as an outpt.    DISCHARGE MEDICATION: Allergies as of 11/30/2023   No Known Allergies      Medication List     STOP taking these medications    nitrofurantoin 50 MG capsule Commonly known as: MACRODANTIN       TAKE these medications    ciprofloxacin 500 MG tablet Commonly known as: CIPRO Take 1 tablet (500 mg total) by mouth 2 (two) times daily for 13 doses.   folic acid 1 MG tablet Commonly known as: FOLVITE Take 2 tablets (2 mg total) by mouth 2 (two) times daily.   LamoTRIgine 300 MG Tb24 24 hour tablet Take by mouth.   ondansetron 4 MG disintegrating tablet Commonly known as: ZOFRAN-ODT Take 1 tablet (4 mg total) by mouth every 8 (eight) hours as needed for up to 5 days.   oxybutynin 5 MG tablet Commonly known as: DITROPAN Take 1 tablet (5 mg total) by mouth every 8 (eight) hours as needed for up to 9 doses for bladder spasms.   oxyCODONE 5 MG immediate release tablet Commonly known as: Roxicodone Take 1 tablet (5 mg total) by mouth every 6 (six) hours as needed for up to 16 doses for severe  pain (pain score 7-10).   tamsulosin 0.4 MG Caps capsule Commonly known as: FLOMAX Take 1 capsule (0.4 mg total) by mouth daily for 14 days. Start taking on: December 01, 2023   valACYclovir 500 MG tablet Commonly known as: VALTREX Take 500 mg by mouth 2 (two) times daily.        Discharge Exam: Filed Weights   11/29/23 1150  Weight: 49.9 kg   Physical Exam Constitutional:      Appearance: She is well-developed.  HENT:     Mouth/Throat:     Mouth: Mucous membranes are moist.  Cardiovascular:     Rate and Rhythm: Normal rate and regular rhythm.  Pulmonary:     Effort: Pulmonary effort is normal.  Abdominal:     Palpations: Abdomen is soft.  Skin:    General: Skin is warm.  Neurological:     Mental Status: She is alert and oriented to person, place, and time.  Psychiatric:        Mood and Affect: Mood normal.      Condition at discharge: fair  The results of significant diagnostics from this hospitalization (including imaging, microbiology, ancillary and laboratory) are listed below for reference.   Imaging Studies: DG C-Arm 1-60 Min-No Report Result Date: 11/29/2023 Fluoroscopy was utilized by the requesting physician.  No radiographic interpretation.   CT ABDOMEN PELVIS W CONTRAST Result Date: 11/28/2023 CLINICAL DATA:  RIGHT lower quadrant abdominal pain. EXAM: CT ABDOMEN AND PELVIS WITH CONTRAST TECHNIQUE: Multidetector CT imaging of the abdomen and pelvis was performed using the standard protocol following bolus administration of intravenous contrast. RADIATION DOSE REDUCTION: This exam was performed according to the departmental dose-optimization program which includes automated exposure control, adjustment of the mA and/or kV according to patient size and/or use of iterative reconstruction technique. CONTRAST:  75mL OMNIPAQUE IOHEXOL 300 MG/ML  SOLN COMPARISON:  CT abdomen pelvis 10/29/2022 FINDINGS: Lower chest: Lung bases are clear. Hepatobiliary: No focal  hepatic lesion. Normal gallbladder. No biliary duct dilatation. Common bile duct is normal. Pancreas: Pancreas is normal. No ductal dilatation. No pancreatic inflammation. Spleen: Normal spleen Adrenals/urinary tract: Adrenal glands normal. There is hydronephrosis of the RIGHT renal collecting system. There is a partially obstructing calculus at the RIGHT ureteropelvic junction measuring 10 mm (image 29/3) distal to the RIGHT ureteropelvic junction calculus the ureter remains dilated into the pelvis. There is a second obstructing calculus in the distal RIGHT ureter measuring 6 mm on image 66/3. This distal RIGHT ureteral calculus is within 1 cm of the RIGHT vesicoureteral junction. Single nonobstructing calculus in the upper pole of the LEFT kidney. Stomach/Bowel: Stomach, small bowel, appendix, and cecum are normal. The colon and rectosigmoid colon are normal. Vascular/Lymphatic: Abdominal aorta is normal caliber. No periportal or retroperitoneal adenopathy. No pelvic adenopathy. Reproductive: Uterus and ovaries are normal. There are prominent parametrial veins along the LEFT margin of the uterus which drain into the LEFT gonadal vein. Other: No free fluid. Musculoskeletal: No aggressive osseous lesion. IMPRESSION: 1. Partially obstructing calculus at the RIGHT ureteropelvic junction. RIGHT hydronephrosis. 2. Obstructing calculus in the distal RIGHT ureter. RIGHT hydroureter. 3. Nonobstructing calculus in the upper pole of the LEFT kidney. 4. Normal appendix. 5. Prominent parametrial veins along the LEFT margin of the uterus which drain into the LEFT gonadal vein. Findings can be seen in the setting of pelvic congestion syndrome. Electronically Signed   By: Genevive Bi M.D.   On: 11/28/2023 16:49    Microbiology: Results for orders placed or performed in visit on 11/05/23  Microscopic Examination     Status: Abnormal   Collection Time: 11/05/23 11:44 AM   Urine  Result Value Ref Range Status   WBC, UA  >30 (A) 0 - 5 /hpf Final   RBC, Urine 3-10 (A) 0 - 2 /hpf Final   Epithelial Cells (non renal) 0-10 0 - 10 /hpf Final   Bacteria, UA Many (A) None seen/Few Final  CULTURE, URINE COMPREHENSIVE     Status: Abnormal   Collection Time: 11/05/23 12:11 PM   Specimen: Urine   UR  Result Value Ref Range Status   Urine Culture, Comprehensive Final report (A)  Final   Organism ID, Bacteria Escherichia coli (A)  Final    Comment: Cefazolin with an MIC <=16 predicts susceptibility to the oral agents cefaclor, cefdinir, cefpodoxime, cefprozil, cefuroxime, cephalexin, and loracarbef when used for therapy of uncomplicated urinary tract infections due to E. coli, Klebsiella pneumoniae, and Proteus mirabilis. Greater than 100,000 colony forming units per mL    Organism ID, Bacteria Comment  Final    Comment: Mixed urogenital flora 10,000-25,000 colony forming units per mL    ANTIMICROBIAL SUSCEPTIBILITY Comment  Final    Comment:       ** S = Susceptible; I = Intermediate; R = Resistant **  P = Positive; N = Negative             MICS are expressed in micrograms per mL    Antibiotic                 RSLT#1    RSLT#2    RSLT#3    RSLT#4 Amoxicillin/Clavulanic Acid    S Ampicillin                     S Cefazolin                      S Cefepime                       S Cefoxitin                      S Cefpodoxime                    S Ceftriaxone                    S Ciprofloxacin                  S Ertapenem                      S Gentamicin                     S Levofloxacin                   S Meropenem                      S Nitrofurantoin                 S Piperacillin/Tazobactam        S Tetracycline                   S Tobramycin                     S Trimethoprim/Sulfa             S     Labs: CBC: Recent Labs  Lab 11/28/23 1247 11/29/23 0550 11/30/23 0537  WBC 5.5 4.8 8.6  HGB 13.5 13.7 13.4  HCT 41.1 41.1 40.4  MCV 89.3 87.3 88.8  PLT 185 178 207   Basic  Metabolic Panel: Recent Labs  Lab 11/28/23 1247 11/29/23 0550 11/30/23 0537  NA 136 139 135  K 3.9 3.9 4.1  CL 105 106 106  CO2 21* 26 22  GLUCOSE 93 111* 113*  BUN 16 18 18   CREATININE 0.59 0.77 0.77  CALCIUM 8.9 8.8* 8.8*  MG  --   --  2.2   Liver Function Tests: Recent Labs  Lab 11/28/23 1247 11/30/23 0537  AST 15 20  ALT 17 19  ALKPHOS 46 42  BILITOT 1.2 0.8  PROT 7.1 7.1  ALBUMIN 4.4 4.1   CBG: No results for input(s): "GLUCAP" in the last 168 hours.  Discharge time spent: greater than 30 minutes.  Signed: Kylieann Eagles , MD [facility] 11/30/2023

## 2023-11-30 NOTE — Telephone Encounter (Signed)
 Per Dr. Cherylene Corrente, Patient is to be scheduled for Right Ureteroscopy with Laser Lithotripsy and Stent Exchange   Hailey Pennington was contacted and possible surgical dates were discussed, Tuesday April 22nd, 2025 was agreed upon for surgery.   Patient was directed to call [phone] between 1-3pm the day before surgery to find out surgical arrival time.  Instructions were given not to eat or drink from midnight on the night before surgery and have a driver for the day of surgery. On the surgery day patient was instructed to enter through the [facility] entrance of [facility] report the Same Day Surgery desk.   Pre-Admit Testing will be in contact via phone to set up an interview with the anesthesia team to review your history and medications prior to surgery.   Reminder of this information was sent via MyChart to the patient.

## 2023-11-30 NOTE — Progress Notes (Addendum)
 Urology Inpatient Progress Note  Subjective: No acute events overnight. She is afebrile, VSS. WBC count WNL, 8.6. Creatinine stable, 0.77. Urine cultures pending; on antibiotics as below. She is voiding spontaneously with some dysuria. Today she reports persistent RLQ pain that is poorly controlled.  Anti-infectives: Anti-infectives (From admission, onward)    Start     Dose/Rate Route Frequency Ordered Stop   11/30/23 1015  ciprofloxacin (CIPRO) tablet 500 mg        500 mg Oral 2 times daily 11/30/23 0919     11/30/23 0815  cefTRIAXone (ROCEPHIN) 1 g in sodium chloride 0.9 % 100 mL IVPB  Status:  Discontinued        1 g 200 mL/hr over 30 Minutes Intravenous  Once 11/30/23 0812 11/30/23 0919   11/29/23 1400  cefTRIAXone (ROCEPHIN) 1 g in sodium chloride 0.9 % 100 mL IVPB  Status:  Discontinued        1 g 200 mL/hr over 30 Minutes Intravenous Every 24 hours 11/28/23 1947 11/30/23 0812   11/28/23 1415  cefTRIAXone (ROCEPHIN) 1 g in sodium chloride 0.9 % 100 mL IVPB        1 g 200 mL/hr over 30 Minutes Intravenous  Once 11/28/23 1408 11/28/23 1627       Current Facility-Administered Medications  Medication Dose Route Frequency Provider Last Rate Last Admin   acetaminophen (TYLENOL) tablet 650 mg  650 mg Oral Q6H PRN Avi Body, MD       Or   acetaminophen (TYLENOL) suppository 650 mg  650 mg Rectal Q6H PRN Avi Body, MD       ciprofloxacin (CIPRO) tablet 500 mg  500 mg Oral BID Sira, Zackery, MD       HYDROmorphone (DILAUDID) injection 0.5-1 mg  0.5-1 mg Intravenous Q3H PRN Basaraba, Iulia, MD   1 mg at 11/30/23 0105   ketorolac (TORADOL) 15 MG/ML injection 15 mg  15 mg Intravenous Q6H Basaraba, Iulia, MD   15 mg at 11/30/23 0538   lamoTRIgine (LAMICTAL XR) 24 hour tablet 300 mg  300 mg Oral Daily Basaraba, Iulia, MD   300 mg at 11/29/23 0957   nicotine (NICODERM CQ - dosed in mg/24 hours) patch 21 mg  21 mg Transdermal Daily Duncan, Hazel V, MD   21 mg at 11/29/23 1831    ondansetron (ZOFRAN) tablet 4 mg  4 mg Oral Q6H PRN Avi Body, MD       Or   ondansetron (ZOFRAN) injection 4 mg  4 mg Intravenous Q6H PRN Basaraba, Iulia, MD   4 mg at 11/30/23 0245   oxybutynin (DITROPAN) tablet 5 mg  5 mg Oral Q8H PRN Elijahjames Fuelling, PA-C       oxyCODONE (Oxy IR/ROXICODONE) immediate release tablet 5 mg  5 mg Oral Once Sira, Zackery, MD       polyethylene glycol (MIRALAX / GLYCOLAX) packet 17 g  17 g Oral Daily PRN Basaraba, Iulia, MD       simethicone (MYLICON) 40 MG/0.6ML suspension 80 mg  80 mg Oral QID PRN Belue, Nathan S, RPH   80 mg at 11/30/23 0312   sodium chloride flush (NS) 0.9 % injection 3 mL  3 mL Intravenous Q12H Avi Body, MD   3 mL at 11/29/23 2000   tamsulosin (FLOMAX) capsule 0.4 mg  0.4 mg Oral Daily Messiyah Waterson, PA-C       Objective: Vital signs in last 24 hours: Temp:  [97.5 F (36.4 C)-98.5 F (36.9 C)] 98.2 F (36.8  C) (04/15 0900) Pulse Rate:  [56-91] 82 (04/15 0900) Resp:  [12-20] 18 (04/15 0900) BP: (91-119)/(54-82) 106/60 (04/15 0900) SpO2:  [98 %-100 %] 100 % (04/15 0900) Weight:  [49.9 kg] 49.9 kg (04/14 1150)  Intake/Output from previous day: 04/14 0701 - 04/15 0700 In: 300 [P.O.:240; IV Piggyback:60] Out: 505 [Urine:500; Blood:5] Intake/Output this shift: No intake/output data recorded.  Physical Exam Vitals and nursing note reviewed.  Constitutional:      General: She is not in acute distress.    Appearance: She is not ill-appearing, toxic-appearing or diaphoretic.  HENT:     Head: Normocephalic and atraumatic.  Pulmonary:     Effort: Pulmonary effort is normal. No respiratory distress.  Skin:    General: Skin is warm and dry.  Neurological:     Mental Status: She is alert and oriented to person, place, and time.  Psychiatric:        Mood and Affect: Mood normal.        Behavior: Behavior normal.    Lab Results:  Recent Labs    11/29/23 0550 11/30/23 0537  WBC 4.8 8.6  HGB 13.7 13.4   HCT 41.1 40.4  PLT 178 207   BMET Recent Labs    11/29/23 0550 11/30/23 0537  NA 139 135  K 3.9 4.1  CL 106 106  CO2 26 22  GLUCOSE 111* 113*  BUN 18 18  CREATININE 0.77 0.77  CALCIUM 8.8* 8.8*   Studies/Results: DG C-Arm 1-60 Min-No Report Result Date: 11/29/2023 Fluoroscopy was utilized by the requesting physician.  No radiographic interpretation.   CT ABDOMEN PELVIS W CONTRAST Result Date: 11/28/2023 CLINICAL DATA:  RIGHT lower quadrant abdominal pain. EXAM: CT ABDOMEN AND PELVIS WITH CONTRAST TECHNIQUE: Multidetector CT imaging of the abdomen and pelvis was performed using the standard protocol following bolus administration of intravenous contrast. RADIATION DOSE REDUCTION: This exam was performed according to the departmental dose-optimization program which includes automated exposure control, adjustment of the mA and/or kV according to patient size and/or use of iterative reconstruction technique. CONTRAST:  75mL OMNIPAQUE IOHEXOL 300 MG/ML  SOLN COMPARISON:  CT abdomen pelvis 10/29/2022 FINDINGS: Lower chest: Lung bases are clear. Hepatobiliary: No focal hepatic lesion. Normal gallbladder. No biliary duct dilatation. Common bile duct is normal. Pancreas: Pancreas is normal. No ductal dilatation. No pancreatic inflammation. Spleen: Normal spleen Adrenals/urinary tract: Adrenal glands normal. There is hydronephrosis of the RIGHT renal collecting system. There is a partially obstructing calculus at the RIGHT ureteropelvic junction measuring 10 mm (image 29/3) distal to the RIGHT ureteropelvic junction calculus the ureter remains dilated into the pelvis. There is a second obstructing calculus in the distal RIGHT ureter measuring 6 mm on image 66/3. This distal RIGHT ureteral calculus is within 1 cm of the RIGHT vesicoureteral junction. Single nonobstructing calculus in the upper pole of the LEFT kidney. Stomach/Bowel: Stomach, small bowel, appendix, and cecum are normal. The colon and  rectosigmoid colon are normal. Vascular/Lymphatic: Abdominal aorta is normal caliber. No periportal or retroperitoneal adenopathy. No pelvic adenopathy. Reproductive: Uterus and ovaries are normal. There are prominent parametrial veins along the LEFT margin of the uterus which drain into the LEFT gonadal vein. Other: No free fluid. Musculoskeletal: No aggressive osseous lesion. IMPRESSION: 1. Partially obstructing calculus at the RIGHT ureteropelvic junction. RIGHT hydronephrosis. 2. Obstructing calculus in the distal RIGHT ureter. RIGHT hydroureter. 3. Nonobstructing calculus in the upper pole of the LEFT kidney. 4. Normal appendix. 5. Prominent parametrial veins along the LEFT margin of the  uterus which drain into the LEFT gonadal vein. Findings can be seen in the setting of pelvic congestion syndrome. Electronically Signed   By: Deboraha Fallow M.D.   On: 11/28/2023 16:49   Assessment & Plan: 38 y.o. female with PMH recurrent UTI now s/p right ureteral stent placement with Dr. Cherylene Corrente for management of obstructing right ureteral stones and complicated UTI.  She is clinically improving on empiric antibiotics.  She is tolerating her stent with some persistent RLQ discomfort.  We discussed that she will require ~7 days of culture-appropriate antibiotics to treat her infection, followed by outpatient right ureteroscopy with laser lithotripsy and stent exchange in 1-3 weeks to treat her stones. She expressed understanding.  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  Recommendations: -Continue empiric antibiotics and follow cultures for a total of ~7 days of culture-appropriate therapy. -Start Flomax 0.4mg  daily and oxybutynin 5mg  every 8 hours as needed for stent discomfort (order placed). -Outpatient right URS/LL/stent exchange with Dr. Cherylene Corrente in 1-3 weeks (order in);  our scheduler will contact her after discharge to arrange  Kathreen Pare, PA-C 11/30/2023

## 2023-11-30 NOTE — Progress Notes (Signed)
 Surgical Physician Order Form [facility] Urology   Dr. Cherylene Corrente * Scheduling expectation : 1 week if culture negative  *Length of Case:   *Clearance needed: no  *Anticoagulation Instructions: N/A  *Aspirin Instructions: N/A  *Post-op visit Date/Instructions:  TBD  *Diagnosis: Right Ureteral Stones  *Procedure: right  Ureteroscopy w/laser lithotripsy & stent exchange (40981)   Additional orders: N/A  -Admit type: OUTpatient  -Anesthesia: General  -VTE Prophylaxis Standing Order SCD's       Other:   -Standing Lab Orders Per Anesthesia    Lab other: None  -Standing Test orders EKG/Chest x-ray per Anesthesia       Test other:   - Medications:  TBD per culture results  -Other orders:  N/A

## 2023-11-30 NOTE — Progress Notes (Signed)
   [facility] Surgical Posting Form  Surgery Date: Date: 12/07/2023  Surgeon: Dr. Darlynn Elam, MD  Inpt ( No  )   Outpt (Yes)   Obs ( No  )   Diagnosis: N20.1 Right Ureteral Stone  -CPT: [phone]  Surgery: Right Ureteroscopy with Laser Lithotripsy and Stent Exchange  Stop Anticoagulations: N/A  Cardiac/Medical/Pulmonary Clearance needed: no  *Orders entered into EPIC  Date: 11/30/23   *Case booked in [place]  Date: 11/30/23  *Notified pt of Surgery: Date: 11/30/23  PRE-OP UA & CX: obtain while inpatient  *Placed into Prior Authorization Work [place] Date: 11/30/23  Assistant/laser/rep:No

## 2023-12-01 ENCOUNTER — Other Ambulatory Visit: Payer: Self-pay | Admitting: *Deleted

## 2023-12-01 MED ORDER — SOLIFENACIN SUCCINATE 10 MG PO TABS: 10.0000 mg | ORAL_TABLET | Freq: Every day | ORAL | 0 refills | Status: DC

## 2023-12-01 NOTE — Telephone Encounter (Signed)
 Notified patient as instructed, patient pleased. Patient is going to stop by office to pick up gemtesa and go pick up solifenacin

## 2023-12-02 ENCOUNTER — Inpatient Hospital Stay
Admission: RE | Admit: 2023-12-02 | Discharge: 2023-12-02 | Disposition: A | Discharge status: Home / Self Care | Source: Ambulatory Visit

## 2023-12-02 ENCOUNTER — Inpatient Hospital Stay
Admission: EM | Admit: 2023-12-02 | Discharge: 2023-12-04 | DRG: 699 | Disposition: A | Discharge status: Home / Self Care | Attending: Internal Medicine | Admitting: Internal Medicine

## 2023-12-02 ENCOUNTER — Emergency Department

## 2023-12-02 ENCOUNTER — Other Ambulatory Visit: Payer: Self-pay

## 2023-12-02 DIAGNOSIS — N23 Unspecified renal colic: Secondary | ICD-10-CM | POA: Diagnosis not present

## 2023-12-02 DIAGNOSIS — T83592A Infection and inflammatory reaction due to indwelling ureteral stent, initial encounter: Principal | ICD-10-CM | POA: Diagnosis present

## 2023-12-02 DIAGNOSIS — Z79899 Other long term (current) drug therapy: Secondary | ICD-10-CM

## 2023-12-02 DIAGNOSIS — I2510 Atherosclerotic heart disease of native coronary artery without angina pectoris: Secondary | ICD-10-CM | POA: Diagnosis present

## 2023-12-02 DIAGNOSIS — Z87442 Personal history of urinary calculi: Secondary | ICD-10-CM

## 2023-12-02 DIAGNOSIS — Z960 Presence of urogenital implants: Secondary | ICD-10-CM | POA: Diagnosis present

## 2023-12-02 DIAGNOSIS — N12 Tubulo-interstitial nephritis, not specified as acute or chronic: Principal | ICD-10-CM | POA: Diagnosis present

## 2023-12-02 DIAGNOSIS — R109 Unspecified abdominal pain: Principal | ICD-10-CM

## 2023-12-02 DIAGNOSIS — N390 Urinary tract infection, site not specified: Secondary | ICD-10-CM | POA: Diagnosis present

## 2023-12-02 DIAGNOSIS — Z87891 Personal history of nicotine dependence: Secondary | ICD-10-CM

## 2023-12-02 DIAGNOSIS — N1339 Other hydronephrosis: Secondary | ICD-10-CM | POA: Diagnosis present

## 2023-12-02 DIAGNOSIS — Z8049 Family history of malignant neoplasm of other genital organs: Secondary | ICD-10-CM

## 2023-12-02 DIAGNOSIS — N200 Calculus of kidney: Secondary | ICD-10-CM

## 2023-12-02 DIAGNOSIS — N202 Calculus of kidney with calculus of ureter: Secondary | ICD-10-CM | POA: Diagnosis present

## 2023-12-02 DIAGNOSIS — N201 Calculus of ureter: Secondary | ICD-10-CM

## 2023-12-02 DIAGNOSIS — R000 Tachycardia, unspecified: Secondary | ICD-10-CM | POA: Diagnosis present

## 2023-12-02 DIAGNOSIS — Z800 Family history of malignant neoplasm of digestive organs: Secondary | ICD-10-CM

## 2023-12-02 DIAGNOSIS — G40909 Epilepsy, unspecified, not intractable, without status epilepticus: Secondary | ICD-10-CM | POA: Diagnosis present

## 2023-12-02 DIAGNOSIS — I9589 Other hypotension: Secondary | ICD-10-CM | POA: Diagnosis present

## 2023-12-02 DIAGNOSIS — N3281 Overactive bladder: Secondary | ICD-10-CM | POA: Diagnosis present

## 2023-12-02 DIAGNOSIS — K5903 Drug induced constipation: Secondary | ICD-10-CM

## 2023-12-02 DIAGNOSIS — I959 Hypotension, unspecified: Secondary | ICD-10-CM | POA: Insufficient documentation

## 2023-12-02 DIAGNOSIS — J449 Chronic obstructive pulmonary disease, unspecified: Secondary | ICD-10-CM | POA: Diagnosis present

## 2023-12-02 DIAGNOSIS — E785 Hyperlipidemia, unspecified: Secondary | ICD-10-CM | POA: Diagnosis present

## 2023-12-02 LAB — COMPREHENSIVE METABOLIC PANEL WITH GFR
ALT: 21 U/L (ref 0–44)
AST: 20 U/L (ref 15–41)
Albumin: 4 g/dL (ref 3.5–5.0)
Alkaline Phosphatase: 41 U/L (ref 38–126)
Anion gap: 8 (ref 5–15)
BUN: 20 mg/dL (ref 6–20)
CO2: 24 mmol/L (ref 22–32)
Calcium: 8.5 mg/dL — ABNORMAL LOW (ref 8.9–10.3)
Chloride: 104 mmol/L (ref 98–111)
Creatinine, Ser: 0.85 mg/dL (ref 0.44–1.00)
GFR, Estimated: >60 mL/min (ref >60)
Glucose, Bld: 100 mg/dL — ABNORMAL HIGH (ref 70–99)
Potassium: 3.5 mmol/L (ref 3.5–5.1)
Sodium: 136 mmol/L (ref 135–145)
Total Bilirubin: 0.7 mg/dL (ref 0.0–1.2)
Total Protein: 6.5 g/dL (ref 6.5–8.1)

## 2023-12-02 LAB — CBC WITH DIFFERENTIAL/PLATELET
Abs Immature Granulocytes: 0.02 10*3/uL (ref 0.00–0.07)
Basophils Absolute: 0 10*3/uL (ref 0.0–0.1)
Basophils Relative: 1 %
Eosinophils Absolute: 0.2 10*3/uL (ref 0.0–0.5)
Eosinophils Relative: 4 %
HCT: 37.7 % (ref 36.0–46.0)
Hemoglobin: 12.6 g/dL (ref 12.0–15.0)
Immature Granulocytes: 0 %
Lymphocytes Relative: 21 %
Lymphs Abs: 1.3 10*3/uL (ref 0.7–4.0)
MCH: 29.2 pg (ref 26.0–34.0)
MCHC: 33.4 g/dL (ref 30.0–36.0)
MCV: 87.5 fL (ref 80.0–100.0)
Monocytes Absolute: 0.7 10*3/uL (ref 0.1–1.0)
Monocytes Relative: 11 %
Neutro Abs: 4 10*3/uL (ref 1.7–7.7)
Neutrophils Relative %: 63 %
Platelets: 157 10*3/uL (ref 150–400)
RBC: 4.31 MIL/uL (ref 3.87–5.11)
RDW: 12.8 % (ref 11.5–15.5)
WBC: 6.3 10*3/uL (ref 4.0–10.5)
nRBC: 0 % (ref 0.0–0.2)

## 2023-12-02 LAB — URINALYSIS, ROUTINE W REFLEX MICROSCOPIC
Bilirubin Urine: NEGATIVE
Glucose, UA: NEGATIVE mg/dL
Ketones, ur: NEGATIVE mg/dL
Nitrite: NEGATIVE
Protein, ur: >=300 mg/dL — AB
RBC / HPF: >50 RBC/hpf (ref 0–5)
Specific Gravity, Urine: 1.012 (ref 1.005–1.030)
pH: 6 (ref 5.0–8.0)

## 2023-12-02 LAB — PREGNANCY, URINE: Preg Test, Ur: NEGATIVE

## 2023-12-02 LAB — LACTIC ACID, PLASMA: Lactic Acid, Venous: 0.9 mmol/L (ref 0.5–1.9)

## 2023-12-02 MED ORDER — KETOROLAC TROMETHAMINE 15 MG/ML IJ SOLN
15.0000 mg | Freq: Once | INTRAMUSCULAR | Status: AC
  Administered 2023-12-02: 15 mg via INTRAVENOUS
  Filled 2023-12-02: qty 1

## 2023-12-02 MED ORDER — ONDANSETRON HCL 4 MG/2ML IJ SOLN
4.0000 mg | Freq: Four times a day (QID) | INTRAMUSCULAR | Status: DC | PRN
  Administered 2023-12-02 – 2023-12-03 (×3): 4 mg via INTRAVENOUS
  Filled 2023-12-02 (×3): qty 2

## 2023-12-02 MED ORDER — ONDANSETRON HCL 4 MG/2ML IJ SOLN
4.0000 mg | Freq: Once | INTRAMUSCULAR | Status: AC
  Administered 2023-12-02: 4 mg via INTRAVENOUS
  Filled 2023-12-02: qty 2

## 2023-12-02 MED ORDER — POLYETHYLENE GLYCOL 3350 17 G PO PACK
17.0000 g | PACK | Freq: Two times a day (BID) | ORAL | Status: DC
  Administered 2023-12-02 – 2023-12-04 (×5): 17 g via ORAL
  Filled 2023-12-02 (×5): qty 1

## 2023-12-02 MED ORDER — TAMSULOSIN HCL 0.4 MG PO CAPS
0.4000 mg | ORAL_CAPSULE | Freq: Every day | ORAL | Status: DC
  Administered 2023-12-02 – 2023-12-04 (×3): 0.4 mg via ORAL
  Filled 2023-12-02 (×3): qty 1

## 2023-12-02 MED ORDER — KETOROLAC TROMETHAMINE 30 MG/ML IJ SOLN
15.0000 mg | Freq: Four times a day (QID) | INTRAMUSCULAR | Status: DC | PRN
  Administered 2023-12-02 – 2023-12-04 (×5): 15 mg via INTRAVENOUS
  Filled 2023-12-02 (×5): qty 1

## 2023-12-02 MED ORDER — SODIUM CHLORIDE 0.9 % IV BOLUS
1000.0000 mL | Freq: Once | INTRAVENOUS | Status: AC
  Administered 2023-12-02: 1000 mL via INTRAVENOUS

## 2023-12-02 MED ORDER — MORPHINE SULFATE (PF) 4 MG/ML IV SOLN
4.0000 mg | Freq: Once | INTRAVENOUS | Status: AC
  Administered 2023-12-02: 4 mg via INTRAVENOUS
  Filled 2023-12-02: qty 1

## 2023-12-02 MED ORDER — NICOTINE 21 MG/24HR TD PT24
21.0000 mg | MEDICATED_PATCH | Freq: Every day | TRANSDERMAL | Status: DC
  Administered 2023-12-02 – 2023-12-04 (×3): 21 mg via TRANSDERMAL
  Filled 2023-12-02 (×3): qty 1

## 2023-12-02 MED ORDER — LAMOTRIGINE ER 100 MG PO TB24
300.0000 mg | ORAL_TABLET | Freq: Every day | ORAL | Status: DC
  Administered 2023-12-02 – 2023-12-04 (×3): 300 mg via ORAL
  Filled 2023-12-02 (×4): qty 3

## 2023-12-02 MED ORDER — HYDROMORPHONE HCL 1 MG/ML IJ SOLN
0.5000 mg | INTRAMUSCULAR | Status: DC | PRN
  Administered 2023-12-02 – 2023-12-03 (×7): 0.5 mg via INTRAVENOUS
  Filled 2023-12-02 (×7): qty 0.5

## 2023-12-02 MED ORDER — OXYCODONE HCL 5 MG PO TABS: 5.0000 mg | ORAL_TABLET | Freq: Four times a day (QID) | ORAL | Status: DC | PRN

## 2023-12-02 MED ORDER — OXYBUTYNIN CHLORIDE 5 MG PO TABS: 5.0000 mg | ORAL_TABLET | Freq: Three times a day (TID) | ORAL | Status: DC | PRN

## 2023-12-02 MED ORDER — BISACODYL 5 MG PO TBEC
5.0000 mg | DELAYED_RELEASE_TABLET | Freq: Two times a day (BID) | ORAL | Status: DC
  Administered 2023-12-02: 5 mg via ORAL
  Filled 2023-12-02 (×3): qty 1

## 2023-12-02 MED ORDER — CIPROFLOXACIN IN D5W 400 MG/200ML IV SOLN
400.0000 mg | Freq: Two times a day (BID) | INTRAVENOUS | Status: DC
  Administered 2023-12-02 – 2023-12-03 (×3): 400 mg via INTRAVENOUS
  Filled 2023-12-02 (×3): qty 200

## 2023-12-02 MED ORDER — SODIUM CHLORIDE 0.9 % IV SOLN: INTRAVENOUS | Status: DC

## 2023-12-02 NOTE — ED Provider Notes (Addendum)
 [facility] Provider Note    Event Date/Time   First MD Initiated Contact with Patient 12/02/23 [phone]     (approximate)   History   Flank Pain   HPI  Hailey Pennington is a 38 y.o. female   Past medical history of history of epilepsy, syringomyelia, recent kidney stone and infection requiring stenting and hospitalization discharged on ciprofloxacin who presents emerged department with worsening right flank pain in the area of her initial pain leading to presentation to the hospital within the last week.  When she was discharged from the hospital her pain was moderately well-controlled still sore to the right flank and intermittently could get severe but tolerable such that she could perform her activities of daily living at home.  Then last evening it became severe and constant in the right flank.  She has been taking her home medications including her hydrocodone and despite this the pain is quite severe.  Independent Historian contributed to assessment above: Her husband at bedside to corroborate information past medical history above  External Medical Documents Reviewed: Discharge summary from hospital on 11/30/2023 documenting history of ureterolithiasis including a 10 mm right UPJ calculus and a 6 mm right distal ureteral calculus status post tenting with definitive stone treatment to be scheduled within the next 2 weeks      Physical Exam   Triage Vital Signs: ED Triage Vitals  Encounter Vitals Group     BP 12/02/23 0349 119/76     Systolic BP Percentile --      Diastolic BP Percentile --      Pulse Rate 12/02/23 0349 100     Resp 12/02/23 0349 18     Temp 12/02/23 0345 98.7 F (37.1 C)     Temp Source 12/02/23 0345 Oral     SpO2 12/02/23 0349 100 %     Weight 12/02/23 0345 110 lb (49.9 kg)     Height 12/02/23 0345 5\' 1"  (1.549 m)     Head Circumference --      Peak Flow --      Pain Score 12/02/23 0345 10     Pain Loc --      Pain  Education --      Exclude from Growth Chart --     Most recent vital signs: Vitals:   12/02/23 0345 12/02/23 0349  BP:  119/76  Pulse:  100  Resp:  18  Temp: 98.7 F (37.1 C)   SpO2:  100%    General: Awake, no distress.  CV:  Good peripheral perfusion.  Resp:  Normal effort.  Abd:  No distention.  Other:  Looks uncomfortable mildly tachycardic at 100.  Afebrile.  Right CVA tenderness to palpation.  Benign abdominal exam.   ED Results / Procedures / Treatments   Labs (all labs ordered are listed, but only abnormal results are displayed) Labs Reviewed  URINALYSIS, ROUTINE W REFLEX MICROSCOPIC - Abnormal; Notable for the following components:      Result Value   Color, Urine AMBER (*)    APPearance HAZY (*)    Hgb urine dipstick LARGE (*)    Protein, ur >=300 (*)    Leukocytes,Ua TRACE (*)    Bacteria, UA MANY (*)    All other components within normal limits  COMPREHENSIVE METABOLIC PANEL WITH GFR - Abnormal; Notable for the following components:   Glucose, Bld 100 (*)    Calcium 8.5 (*)    All other components within normal limits  CULTURE, BLOOD (ROUTINE X 2)  CULTURE, BLOOD (ROUTINE X 2)  CBC WITH DIFFERENTIAL/PLATELET  LACTIC ACID, PLASMA  PREGNANCY, URINE  POC URINE PREG, ED     I ordered and reviewed the above labs they are notable for nl WBC  EKG  ED ECG REPORT I, Buell Carmin, the attending physician, personally viewed and interpreted this ECG.   Date: 12/02/2023  EKG Time: 0434  Rate: 67  Rhythm:  normal sinus rhythm  Axis: nl  ST&T Change: no stemi    RADIOLOGY I independently reviewed and interpreted CT of the abdomen pelvis see no obvious obstructive or inflammatory changes I also reviewed radiologist's formal read.   PROCEDURES:  Critical Care performed: No  Procedures   MEDICATIONS ORDERED IN ED: Medications  ciprofloxacin (CIPRO) IVPB 400 mg (0 mg Intravenous Stopped 12/02/23 0705)  polyethylene glycol (MIRALAX / GLYCOLAX)  packet 17 g (17 g Oral Given 12/02/23 0606)  sodium chloride 0.9 % bolus 1,000 mL (0 mLs Intravenous Stopped 12/02/23 0705)  ondansetron (ZOFRAN) injection 4 mg (4 mg Intravenous Given 12/02/23 0419)  morphine (PF) 4 MG/ML injection 4 mg (4 mg Intravenous Given 12/02/23 0419)  ketorolac (TORADOL) 15 MG/ML injection 15 mg (15 mg Intravenous Given 12/02/23 0419)  morphine (PF) 4 MG/ML injection 4 mg (4 mg Intravenous Given 12/02/23 0606)    External physician / consultants:  I spoke with urologist Dr. Ace Holder regarding care plan for this patient.   IMPRESSION / MDM / ASSESSMENT AND PLAN / ED COURSE  I reviewed the triage vital signs and the nursing notes.                                Patient's presentation is most consistent with acute presentation with potential threat to life or bodily function.  Differential diagnosis includes, but is not limited to, renal colic in the setting of known kidney stones, stent migration, postoperative bleeding, pyelonephritis, sepsis   The patient is on the cardiac monitor to evaluate for evidence of arrhythmia and/or significant heart rate changes.  MDM:   Recurrent severe right flank pain after stent was placed for multiple kidney stones/ureteral stones on the right side with a plan for definitive stone treatment scheduled for 22 April.  She has been taking her outpatient medications and despite this the pain is very severe now and intolerable/constant.  I will treat her with IV pain medications, antiemetic, fluids, and obtain a urinalysis today as well as a CT renal scan to look for postoperative complications.  Get sepsis labs given her suspected urinary tract infection with tachycardia.  Treat with IV dose of her ciprofloxacin.  -- Tachycardia resolved without intervention shortly after being triaged, with normal vital signs, no leukocytosis and fever I doubt sepsis at this time.  Her tachycardia was probably driven by a bit of pain at the moment.  Her  pain is mildly-controlled with Toradol and morphine   --- Pain recurred and quite severe requiring multiple doses of narcotic pain medication.  I spoke with Dr. Ace Holder of urology regarding the patient's care plan and unfortunately definitive stone management should be delayed until next week given her concurrent urinary tract infection.  Since her pain is not well-controlled despite multiple narcotic medications, patient still quite uncomfortable and wants to be admitted for further pain control.  Unfortunately IV lidocaine is not an option for her given her history of seizures.  Also Dr. Ace Holder had suggested belladonna/opium  suppositories but unfortunately this is not on formulary at the hospital anymore.  Hospitalist paged for admission   FINAL CLINICAL IMPRESSION(S) / ED DIAGNOSES   Final diagnoses:  Right flank pain  Pyelonephritis  Ureterolithiasis     Rx / DC Orders   ED Discharge Orders     None        Note:  This document was prepared using Dragon voice recognition software and may include unintentional dictation errors.    Buell Carmin, MD 12/02/23 1610    Buell Carmin, MD 12/02/23 [phone]

## 2023-12-02 NOTE — ED Notes (Signed)
 This RN received report from [employer] and performed bedside care handoff. This RN introduced self to pt. Call light in reach, bed wheels locked, side rail raised, pt updated on plan of care. Rounding completed.

## 2023-12-02 NOTE — ED Triage Notes (Signed)
 Pt to ED with right sided flank pain. Pt seen here and dx w/ kidney stone blockage with stent placement on Monday. No fever, endorses chills.

## 2023-12-02 NOTE — H&P (Signed)
 History and Physical    Hailey Pennington FAO:[id number] DOB: 11-08-1985 DOA: 12/02/2023  PCP: Alm Bustard, NP (Confirm with patient/family/NH records and if not entered, this has to be entered at [facility] point of entry) Patient coming from: Home  I have personally briefly reviewed patient's old medical records in [facility]  Chief Complaint: Worsening of right flank pain  HPI: Hailey Pennington is a 38 y.o. female with medical history significant of right-sided ureter obstructive stone and hydronephrosis status post right ureter stenting recently, seizure disorder, overactive bladder, presented with worsening of right flank pain.  Patient was hospitalized 2 days ago for complicated UTI with acute right-sided obstructive uropathy secondary to obstructive right ureteral stone, underwent cystoscopy and stenting and patient sent home 2 days ago with narcotics.  Patient reported that initially she was doing fine with some residual soreness of the right flank however yesterday the pain has been unbearable despite taking multiple doses of narcotics which made her drowsy but no significant improvement in the right flank pain.  She feels nauseous but no vomiting.  Denied any fever chills or dysuria.  She is scheduled to have cystoscopy scope and laser lithotripsy next week. ED Course: Afebrile, nontachycardic blood pressure 119/76 O2 saturation 100% on room air.  CT abdomen pelvis with contrast showed stable right UPJ stone with a second 6 x 4 mm stone in the distal right ureter, right double-J internal ureteral stent in place and resolution of right-sided hydronephrosis.  Blood work showed WBC 6.6, creatinine 2.8 BUN 20.  UA showed RBCs 3+, WBC 2+.  Patient was given multiple rounds of morphine and Toradol, feeling pain is still poorly controlled.  Review of Systems: As per HPI otherwise 14 point review of systems negative.    Past Medical History:  Diagnosis Date   Herpes    History of  HPV infection    Seizures (HCC)    Syringomyelia (HCC)     Past Surgical History:  Procedure Laterality Date   CESAREAN SECTION     CYSTOSCOPY W/ URETERAL STENT PLACEMENT Right 11/29/2023   Procedure: CYSTOSCOPY, WITH RETROGRADE PYELOGRAM AND URETERAL STENT INSERTION;  Surgeon: Riki Altes, MD;  Location: [facility] ORS;  Service: Urology;  Laterality: Right;   WISDOM TOOTH EXTRACTION       reports that she has been smoking. She has never used smokeless tobacco. She reports current alcohol use. She reports that she does not use drugs.  No Known Allergies  Family History  Problem Relation Age of Onset   Other Mother        precancerous cells in uterine lining   Endometriosis Mother    Testicular cancer Maternal Uncle    Endometriosis Maternal Grandmother    Uterine cancer Maternal Grandmother        late years   Pancreatic cancer Maternal Grandfather        64   Colon cancer Paternal Grandfather    Cancer Paternal Grandfather        eye     Prior to Admission medications   Medication Sig Start Date End Date Taking? Authorizing Provider  ciprofloxacin (CIPRO) 500 MG tablet Take 1 tablet (500 mg total) by mouth 2 (two) times daily for 13 doses. 11/30/23 12/07/23 Yes Baron Hamper, MD  LamoTRIgine 300 MG TB24 24 hour tablet Take 300 mg by mouth daily. 10/17/15  Yes [provider]  ondansetron (ZOFRAN-ODT) 4 MG disintegrating tablet Take 1 tablet (4 mg total) by mouth every 8 (  eight) hours as needed for up to 5 days. 11/30/23 12/05/23 Yes Baron Hamper, MD  oxybutynin (DITROPAN) 5 MG tablet Take 1 tablet (5 mg total) by mouth every 8 (eight) hours as needed for up to 9 doses for bladder spasms. 11/30/23  Yes Baron Hamper, MD  oxyCODONE (ROXICODONE) 5 MG immediate release tablet Take 1 tablet (5 mg total) by mouth every 6 (six) hours as needed for up to 16 doses for severe pain (pain score 7-10). 11/30/23  Yes Baron Hamper, MD  tamsulosin (FLOMAX) 0.4 MG CAPS capsule Take 1  capsule (0.4 mg total) by mouth daily for 14 days. 12/01/23 12/15/23 Yes Baron Hamper, MD  valACYclovir (VALTREX) 500 MG tablet Take 500 mg by mouth 2 (two) times daily as needed (outbreaks). 09/08/21  Yes [provider]  folic acid (FOLVITE) 1 MG tablet Take 2 tablets (2 mg total) by mouth 2 (two) times daily. Patient not taking: Reported on 11/30/2023 10/01/21   Copland, Helmut Muster B, PA-C  solifenacin (VESICARE) 10 MG tablet Take 1 tablet (10 mg total) by mouth daily. 12/01/23   Riki Altes, MD    Physical Exam: Vitals:   12/02/23 0345 12/02/23 0349 12/02/23 0724  BP:  119/76 (!) 84/64  Pulse:  100 70  Resp:  18 15  Temp: 98.7 F (37.1 C)    TempSrc: Oral    SpO2:  100% 98%  Weight: 49.9 kg    Height: 5\' 1"  (1.549 m)      Constitutional: NAD, calm, comfortable Vitals:   12/02/23 0345 12/02/23 0349 12/02/23 0724  BP:  119/76 (!) 84/64  Pulse:  100 70  Resp:  18 15  Temp: 98.7 F (37.1 C)    TempSrc: Oral    SpO2:  100% 98%  Weight: 49.9 kg    Height: 5\' 1"  (1.549 m)     Eyes: PERRL, lids and conjunctivae normal ENMT: Mucous membranes are moist. Posterior pharynx clear of any exudate or lesions.Normal dentition.  Neck: normal, supple, no masses, no thyromegaly Respiratory: clear to auscultation bilaterally, no wheezing, no crackles. Normal respiratory effort. No accessory muscle use.  Cardiovascular: Regular rate and rhythm, no murmurs / rubs / gallops. No extremity edema. 2+ pedal pulses. No carotid bruits.  Abdomen: Mild tenderness on right flank, no rebound or guarding, no masses palpated. No hepatosplenomegaly. Bowel sounds positive.  Musculoskeletal: no clubbing / cyanosis. No joint deformity upper and lower extremities. Good ROM, no contractures. Normal muscle tone.  Skin: no rashes, lesions, ulcers. No induration Neurologic: CN 2-12 grossly intact. Sensation intact, DTR normal. Strength 5/5 in all 4.  Psychiatric: Normal judgment and insight. Alert and  oriented x 3. Normal mood.    Labs on Admission: I have personally reviewed following labs and imaging studies  CBC: Recent Labs  Lab 11/28/23 1247 11/29/23 0550 11/30/23 0537 12/02/23 0350  WBC 5.5 4.8 8.6 6.3  NEUTROABS  --   --   --  4.0  HGB 13.5 13.7 13.4 12.6  HCT 41.1 41.1 40.4 37.7  MCV 89.3 87.3 88.8 87.5  PLT 185 178 207 157   Basic Metabolic Panel: Recent Labs  Lab 11/28/23 1247 11/29/23 0550 11/30/23 0537 12/02/23 0350  NA 136 139 135 136  K 3.9 3.9 4.1 3.5  CL 105 106 106 104  CO2 21* 26 22 24   GLUCOSE 93 111* 113* 100*  BUN 16 18 18 20   CREATININE 0.59 0.77 0.77 0.85  CALCIUM 8.9 8.8* 8.8* 8.5*  MG  --   --  2.2  --    GFR: Estimated Creatinine Clearance: 69.7 mL/min (by C-G formula based on SCr of 0.85 mg/dL). Liver Function Tests: Recent Labs  Lab 11/28/23 1247 11/30/23 0537 12/02/23 0350  AST 15 20 20   ALT 17 19 21   ALKPHOS 46 42 41  BILITOT 1.2 0.8 0.7  PROT 7.1 7.1 6.5  ALBUMIN 4.4 4.1 4.0   Recent Labs  Lab 11/28/23 1247  LIPASE 30   No results for input(s): "AMMONIA" in the last 168 hours. Coagulation Profile: No results for input(s): "INR", "PROTIME" in the last 168 hours. Cardiac Enzymes: No results for input(s): "CKTOTAL", "CKMB", "CKMBINDEX", "TROPONINI" in the last 168 hours. BNP (last 3 results) No results for input(s): "PROBNP" in the last 8760 hours. HbA1C: No results for input(s): "HGBA1C" in the last 72 hours. CBG: No results for input(s): "GLUCAP" in the last 168 hours. Lipid Profile: No results for input(s): "CHOL", "HDL", "LDLCALC", "TRIG", "CHOLHDL", "LDLDIRECT" in the last 72 hours. Thyroid Function Tests: No results for input(s): "TSH", "T4TOTAL", "FREET4", "T3FREE", "THYROIDAB" in the last 72 hours. Anemia Panel: No results for input(s): "VITAMINB12", "FOLATE", "FERRITIN", "TIBC", "IRON", "RETICCTPCT" in the last 72 hours. Urine analysis:    Component Value Date/Time   COLORURINE AMBER (A) 12/02/2023  0420   APPEARANCEUR HAZY (A) 12/02/2023 0420   APPEARANCEUR Cloudy (A) 11/05/2023 1144   LABSPEC 1.012 12/02/2023 0420   PHURINE 6.0 12/02/2023 0420   GLUCOSEU NEGATIVE 12/02/2023 0420   HGBUR LARGE (A) 12/02/2023 0420   BILIRUBINUR NEGATIVE 12/02/2023 0420   BILIRUBINUR Negative 11/05/2023 1144   KETONESUR NEGATIVE 12/02/2023 0420   PROTEINUR >=300 (A) 12/02/2023 0420   UROBILINOGEN negative (A) 06/18/2023 0958   NITRITE NEGATIVE 12/02/2023 0420   LEUKOCYTESUR TRACE (A) 12/02/2023 0420    Radiological Exams on Admission: CT Renal Stone Study Result Date: 12/02/2023 CLINICAL DATA:  Abdominal flank pain. Recent ureteral stone with stent placement. EXAM: CT ABDOMEN AND PELVIS WITHOUT CONTRAST TECHNIQUE: Multidetector CT imaging of the abdomen and pelvis was performed following the standard protocol without IV contrast. RADIATION DOSE REDUCTION: This exam was performed according to the departmental dose-optimization program which includes automated exposure control, adjustment of the mA and/or kV according to patient size and/or use of iterative reconstruction technique. COMPARISON:  11/28/2023 FINDINGS: Lower chest: Unremarkable. Hepatobiliary: No suspicious focal abnormality in the liver on this study without intravenous contrast. There is no evidence for gallstones, gallbladder wall thickening, or pericholecystic fluid. No intrahepatic or extrahepatic biliary dilation. Pancreas: No focal mass lesion. No dilatation of the main duct. No intraparenchymal cyst. No peripancreatic edema. Spleen: No splenomegaly. No suspicious focal mass lesion. Adrenals/Urinary Tract: No adrenal nodule or mass. The 4 mm nonobstructing stone identified upper pole left kidney. No left hydroureteronephrosis. 8 x 8 mm right UPJ stone evident. Additional 6 x 4 mm stone is identified adjacent to the stent in the distal right ureter. Right double-J internal ureteral stent is new in the interval with proximal loop formed in the  right renal pelvis and distal loop formed in the bladder lumen. Tiny gas bubbles in the right renal pelvis and urinary bladder are compatible with the recent instrumentation. Right-sided hydronephrosis seen on the previous study has essentially resolved. No right-sided perinephric fluid collection. Stomach/Bowel: Stomach is unremarkable. No gastric wall thickening. No evidence of outlet obstruction. Duodenum is normally positioned as is the ligament of Treitz. No small bowel wall thickening. No small bowel dilatation. Moderate colonic stool volume. Vascular/Lymphatic: No abdominal aortic aneurysm. No abdominal aortic  atherosclerotic calcification. There is no gastrohepatic or hepatoduodenal ligament lymphadenopathy. No retroperitoneal or mesenteric lymphadenopathy. No pelvic sidewall lymphadenopathy. Reproductive: Unremarkable. Other: No intraperitoneal free fluid. Musculoskeletal: No worrisome lytic or sclerotic osseous abnormality. IMPRESSION: 1. 8 x 8 mm right UPJ stone with a second 6 x 4 mm stone in the distal right ureter. Right double-J internal ureteral stent is new in the interval with interval resolution of the right-sided hydronephrosis seen previously. Tiny gas bubbles in the right renal pelvis and urinary bladder are compatible with recent instrumentation. 2. 4 mm nonobstructing stone upper pole left kidney. 3. Moderate colonic stool volume. Imaging features could be compatible with constipation in the appropriate clinical setting. Electronically Signed   By: Donnal Fusi M.D.   On: 12/02/2023 05:46    EKG: Independently reviewed.  Sinus rhythm, no acute ST changes.  Assessment/Plan Principal Problem:   Renal colic on right side Active Problems:   Nephrolithiasis  (please populate well all problems here in Problem List. (For example, if patient is on BP meds at home and you resume or decide to hold them, it is a problem that needs to be her. Same for CAD, COPD, HLD and so on)  Symptomatic  renal colic Right-sided obstructive uropathy, status post right ureteral stenting - CT abdomen reassuring, showing resolution of right-sided hydronephrosis and stable right ureteral stenting.  Case was discussed with urology, recommend pain control and outpatient follow-up for scheduled lithotripsy laser guided.  Urology is okay with NSAIDs.  Will start patient on Dilaudid alternate with Toradol. - Start Flomax  Complicated UTI - Review of most recent urine culture showed insignificant growth.  Continue empiric Cipro twice daily until patient next visit to urology next weeks for laser guided lithotripsy  Seizure disorder - No acute concern, continue lamotrigine  DVT prophylaxis: SCD Code Status: Full code Family Communication: Husband at bedside Disposition Plan: Expect less than 2 midnight hospital stay Consults called: Curbside consult with urology Admission status: MedSurg observation   [place] MD [facility] Pager [phone]  12/02/2023, 9:38 AM

## 2023-12-03 DIAGNOSIS — N3281 Overactive bladder: Secondary | ICD-10-CM | POA: Diagnosis present

## 2023-12-03 DIAGNOSIS — R000 Tachycardia, unspecified: Secondary | ICD-10-CM | POA: Diagnosis present

## 2023-12-03 DIAGNOSIS — I9589 Other hypotension: Secondary | ICD-10-CM

## 2023-12-03 DIAGNOSIS — J449 Chronic obstructive pulmonary disease, unspecified: Secondary | ICD-10-CM | POA: Diagnosis present

## 2023-12-03 DIAGNOSIS — Z960 Presence of urogenital implants: Secondary | ICD-10-CM | POA: Diagnosis present

## 2023-12-03 DIAGNOSIS — T83592A Infection and inflammatory reaction due to indwelling ureteral stent, initial encounter: Secondary | ICD-10-CM | POA: Diagnosis present

## 2023-12-03 DIAGNOSIS — Z79899 Other long term (current) drug therapy: Secondary | ICD-10-CM | POA: Diagnosis not present

## 2023-12-03 DIAGNOSIS — Z800 Family history of malignant neoplasm of digestive organs: Secondary | ICD-10-CM | POA: Diagnosis not present

## 2023-12-03 DIAGNOSIS — N200 Calculus of kidney: Secondary | ICD-10-CM | POA: Diagnosis not present

## 2023-12-03 DIAGNOSIS — N202 Calculus of kidney with calculus of ureter: Secondary | ICD-10-CM | POA: Diagnosis present

## 2023-12-03 DIAGNOSIS — N390 Urinary tract infection, site not specified: Secondary | ICD-10-CM | POA: Diagnosis not present

## 2023-12-03 DIAGNOSIS — Z87442 Personal history of urinary calculi: Secondary | ICD-10-CM | POA: Diagnosis not present

## 2023-12-03 DIAGNOSIS — N23 Unspecified renal colic: Secondary | ICD-10-CM | POA: Diagnosis present

## 2023-12-03 DIAGNOSIS — N12 Tubulo-interstitial nephritis, not specified as acute or chronic: Secondary | ICD-10-CM | POA: Diagnosis present

## 2023-12-03 DIAGNOSIS — N201 Calculus of ureter: Secondary | ICD-10-CM | POA: Diagnosis present

## 2023-12-03 DIAGNOSIS — I2510 Atherosclerotic heart disease of native coronary artery without angina pectoris: Secondary | ICD-10-CM | POA: Diagnosis present

## 2023-12-03 DIAGNOSIS — I959 Hypotension, unspecified: Secondary | ICD-10-CM | POA: Insufficient documentation

## 2023-12-03 DIAGNOSIS — E785 Hyperlipidemia, unspecified: Secondary | ICD-10-CM | POA: Diagnosis present

## 2023-12-03 DIAGNOSIS — G40909 Epilepsy, unspecified, not intractable, without status epilepticus: Secondary | ICD-10-CM | POA: Diagnosis present

## 2023-12-03 DIAGNOSIS — N1339 Other hydronephrosis: Secondary | ICD-10-CM | POA: Diagnosis present

## 2023-12-03 DIAGNOSIS — Z8049 Family history of malignant neoplasm of other genital organs: Secondary | ICD-10-CM | POA: Diagnosis not present

## 2023-12-03 DIAGNOSIS — Z87891 Personal history of nicotine dependence: Secondary | ICD-10-CM | POA: Diagnosis not present

## 2023-12-03 LAB — CBC
HCT: 34.7 % — ABNORMAL LOW (ref 36.0–46.0)
Hemoglobin: 11.5 g/dL — ABNORMAL LOW (ref 12.0–15.0)
MCH: 29.6 pg (ref 26.0–34.0)
MCHC: 33.1 g/dL (ref 30.0–36.0)
MCV: 89.4 fL (ref 80.0–100.0)
Platelets: 129 10*3/uL — ABNORMAL LOW (ref 150–400)
RBC: 3.88 MIL/uL (ref 3.87–5.11)
RDW: 12.6 % (ref 11.5–15.5)
WBC: 4 10*3/uL (ref 4.0–10.5)
nRBC: 0 % (ref 0.0–0.2)

## 2023-12-03 LAB — MAGNESIUM: Magnesium: 2 mg/dL (ref 1.7–2.4)

## 2023-12-03 MED ORDER — POTASSIUM CHLORIDE IN NACL 20-0.9 MEQ/L-% IV SOLN
INTRAVENOUS | Status: DC
Start: 2023-12-03 — End: 2023-12-04
  Filled 2023-12-03 (×4): qty 1000

## 2023-12-03 MED ORDER — OXYCODONE-ACETAMINOPHEN 5-325 MG PO TABS
1.0000 | ORAL_TABLET | ORAL | Status: DC | PRN
  Administered 2023-12-03 – 2023-12-04 (×2): 1 via ORAL
  Filled 2023-12-03 (×2): qty 1

## 2023-12-03 MED ORDER — SODIUM CHLORIDE 0.9 % IV BOLUS
1000.0000 mL | Freq: Once | INTRAVENOUS | Status: AC
  Administered 2023-12-03: 1000 mL via INTRAVENOUS

## 2023-12-03 MED ORDER — SENNOSIDES-DOCUSATE SODIUM 8.6-50 MG PO TABS
2.0000 | ORAL_TABLET | Freq: Two times a day (BID) | ORAL | Status: DC
  Administered 2023-12-03 – 2023-12-04 (×3): 2 via ORAL
  Filled 2023-12-03 (×3): qty 2

## 2023-12-03 MED ORDER — HYDROXYZINE HCL 10 MG PO TABS
10.0000 mg | ORAL_TABLET | Freq: Three times a day (TID) | ORAL | Status: DC | PRN
  Administered 2023-12-03: 10 mg via ORAL
  Filled 2023-12-03 (×2): qty 1

## 2023-12-03 MED ORDER — SODIUM CHLORIDE 0.9 % IV SOLN
1.0000 g | INTRAVENOUS | Status: DC
  Administered 2023-12-03 – 2023-12-04 (×2): 1 g via INTRAVENOUS
  Filled 2023-12-03 (×2): qty 10

## 2023-12-03 MED ORDER — LACTULOSE 10 GM/15ML PO SOLN
20.0000 g | Freq: Once | ORAL | Status: AC
  Administered 2023-12-03: 20 g via ORAL
  Filled 2023-12-03: qty 30

## 2023-12-03 MED ORDER — SODIUM CHLORIDE 0.9 % IV BOLUS
500.0000 mL | Freq: Once | INTRAVENOUS | Status: AC
  Administered 2023-12-03: 500 mL via INTRAVENOUS

## 2023-12-03 NOTE — Progress Notes (Addendum)
  Progress Note   Patient: Hailey Pennington WUJ:[id number] DOB: Oct 31, 1985 DOA: 12/02/2023     0 DOS: the patient was seen and examined on 12/03/2023   Brief hospital course: Hailey Pennington is a 38 y.o. female with medical history significant of right-sided ureter obstructive stone and hydronephrosis status post right ureter stenting recently, seizure disorder, overactive bladder, presented with worsening of right flank pain.  Urine culture from last admission with low counts with insignificant growth, repeated culture negative.  Patient was treated with Cipro  at that time.    Principal Problem:   Renal colic on right side Active Problems:   Nephrolithiasis   Assessment and Plan: Right-sided renal colic Kidney stone with recent ureteral stent placement. Complicated urinary tract infection. Patient still has some pain in the right flank, continue pain medicine.  This most likely due to recent ureteral stent placement.  I will discontinue IV pain medicine, start oral oxycodone  as IV medicine may cause hypotension inpatient. She also complaining of nausea vomiting associated with the Cipro , I have changed antibiotics to Rocephin .  Anticipating discharge tomorrow with oral Keflex  for additional 7 days. Urine culture was not sent out at this time.  Hypotension Due to lower volume with the pain medicine.  Patient will be given a liter of normal saline bolus today. I do not see any obvious source of hypotension, patient is in the menstrual period,  currently using a pad, not a tampon.  Seizure disorder. Continue home medicines.  Addendum: 1710. Bp lower again. Blood culture negative. Patient has no symptom, give another bolus, start maintenance fluids. Check cortisol level in am.     Subjective:  Still complaining of some right flank and groin pain.  No dysuria or frequent urination today.  No fever or chills.  Physical Exam: Vitals:   12/02/23 1554 12/02/23 1959 12/03/23 0500  12/03/23 0741  BP: 97/69 110/64 103/72 (!) 84/57  Pulse: 75 79 63 61  Resp: 16 19 20 16   Temp: 97.6 F (36.4 C) 97.6 F (36.4 C) 98 F (36.7 C) (!) 97.5 F (36.4 C)  TempSrc:  Oral Oral   SpO2: 100% 100% 100% 100%  Weight:      Height:       General exam: Appears calm and comfortable  Respiratory system: Clear to auscultation. Respiratory effort normal. Cardiovascular system: S1 & S2 heard, RRR. No JVD, murmurs, rubs, gallops or clicks. No pedal edema. Gastrointestinal system: Abdomen is nondistended, soft.  Mild RCA tenderness on right.  No organomegaly or masses felt. Normal bowel sounds heard. Central nervous system: Alert and oriented. No focal neurological deficits. Extremities: Symmetric 5 x 5 power. Skin: No rashes, lesions or ulcers Psychiatry: Judgement and insight appear normal. Mood & affect appropriate.    Data Reviewed:  Reviewed the CT scan results and lab results.  Family Communication: None  Disposition: Status is: Observation      Time spent: 35 minutes  Author: Donaciano Frizzle, MD 12/03/2023 11:07 AM  For on call review [email or web address].

## 2023-12-03 NOTE — Plan of Care (Signed)
  Problem: Health Behavior/Discharge Planning: Goal: Ability to manage health-related needs will improve Outcome: Progressing   Problem: Clinical Measurements: Goal: Will remain free from infection Outcome: Progressing   Problem: Clinical Measurements: Goal: Diagnostic test results will improve Outcome: Progressing   Problem: Activity: Goal: Risk for activity intolerance will decrease Outcome: Progressing   Problem: Coping: Goal: Level of anxiety will decrease Outcome: Progressing   Problem: Elimination: Goal: Will not experience complications related to urinary retention Outcome: Progressing   Problem: Elimination: Goal: Will not experience complications related to bowel motility Outcome: Progressing   Problem: Pain Managment: Goal: General experience of comfort will improve and/or be controlled Outcome: Progressing

## 2023-12-03 NOTE — Hospital Course (Signed)
 Hailey Pennington is a 38 y.o. female with medical history significant of right-sided ureter obstructive stone and hydronephrosis status post right ureter stenting recently, seizure disorder, overactive bladder, presented with worsening of right flank pain.  Urine culture from last admission with low counts with insignificant growth, repeated culture negative.  Patient was treated with Cipro  at that time. He could not tolerate the Cipro , antibiotics changed to Rocephin .  Will continue Keflex  for additional 4 days.  Follow-up with urology.

## 2023-12-04 DIAGNOSIS — N23 Unspecified renal colic: Secondary | ICD-10-CM | POA: Diagnosis not present

## 2023-12-04 DIAGNOSIS — N200 Calculus of kidney: Secondary | ICD-10-CM

## 2023-12-04 DIAGNOSIS — N390 Urinary tract infection, site not specified: Secondary | ICD-10-CM | POA: Diagnosis not present

## 2023-12-04 LAB — BASIC METABOLIC PANEL WITH GFR
Anion gap: 8 (ref 5–15)
BUN: 15 mg/dL (ref 6–20)
CO2: 22 mmol/L (ref 22–32)
Calcium: 8.1 mg/dL — ABNORMAL LOW (ref 8.9–10.3)
Chloride: 109 mmol/L (ref 98–111)
Creatinine, Ser: 0.69 mg/dL (ref 0.44–1.00)
GFR, Estimated: >60 mL/min (ref >60)
Glucose, Bld: 88 mg/dL (ref 70–99)
Potassium: 4 mmol/L (ref 3.5–5.1)
Sodium: 139 mmol/L (ref 135–145)

## 2023-12-04 LAB — CBC
HCT: 33.7 % — ABNORMAL LOW (ref 36.0–46.0)
Hemoglobin: 11 g/dL — ABNORMAL LOW (ref 12.0–15.0)
MCH: 28.8 pg (ref 26.0–34.0)
MCHC: 32.6 g/dL (ref 30.0–36.0)
MCV: 88.2 fL (ref 80.0–100.0)
Platelets: 139 10*3/uL — ABNORMAL LOW (ref 150–400)
RBC: 3.82 MIL/uL — ABNORMAL LOW (ref 3.87–5.11)
RDW: 12.7 % (ref 11.5–15.5)
WBC: 3.8 10*3/uL — ABNORMAL LOW (ref 4.0–10.5)
nRBC: 0 % (ref 0.0–0.2)

## 2023-12-04 LAB — CORTISOL-AM, BLOOD: Cortisol - AM: 4.9 ug/dL — ABNORMAL LOW (ref 6.7–22.6)

## 2023-12-04 LAB — MAGNESIUM: Magnesium: 1.9 mg/dL (ref 1.7–2.4)

## 2023-12-04 MED ORDER — POLYETHYLENE GLYCOL 3350 17 G PO PACK: 17.0000 g | PACK | Freq: Two times a day (BID) | ORAL | 0 refills | Status: DC | PRN

## 2023-12-04 MED ORDER — NICOTINE 21 MG/24HR TD PT24: 21.0000 mg | MEDICATED_PATCH | Freq: Every day | TRANSDERMAL | 0 refills | Status: DC

## 2023-12-04 MED ORDER — CEPHALEXIN 500 MG PO CAPS
500.0000 mg | ORAL_CAPSULE | Freq: Four times a day (QID) | ORAL | 0 refills | Status: AC
Start: 2023-12-04 — End: 2023-12-08

## 2023-12-04 NOTE — Discharge Summary (Signed)
 Physician Discharge Summary   Patient: Hailey Pennington MRN: [id number] DOB: Jul 19, 1986  Admit date:     12/02/2023  Discharge date: 12/04/23  Discharge Physician: Donaciano Frizzle   PCP: Will Hare, NP   Recommendations at discharge:   Follow-up with PCP in 1 week. Follow-up with urology in 1 week.  Discharge Diagnoses: Principal Problem:   Renal colic on right side Active Problems:   Nephrolithiasis   Complicated UTI (urinary tract infection)   Seizure disorder (HCC)   Hypotension   Renal colic  Resolved Problems:   * No resolved hospital problems. *  Hospital Course: Hailey Pennington is a 38 y.o. female with medical history significant of right-sided ureter obstructive stone and hydronephrosis status post right ureter stenting recently, seizure disorder, overactive bladder, presented with worsening of right flank pain.  Urine culture from last admission with low counts with insignificant growth, repeated culture negative.  Patient was treated with Cipro  at that time. He could not tolerate the Cipro , antibiotics changed to Rocephin .  Will continue Keflex  for additional 4 days.  Follow-up with urology.   Assessment and Plan:  Right-sided renal colic Kidney stone with recent ureteral stent placement. Complicated urinary tract infection. Patient still has some pain in the right flank, continue pain medicine.  This most likely due to recent ureteral stent placement.   She also complaining of nausea vomiting associated with the Cipro , I have changed antibiotics to Rocephin .  Patient condition has improved, currently symptom has resolved.  Medically stable for discharge.  Based on previous prescription, patient will require 4 additional days of oral antibiotics.  Urine culture was not sent out at this time. Condition improved, medically stable for discharge.   Hypotension Appears to be secondary to combined dehydration and IV pain medicine.  She received IV fluid bolus,  blood pressure has been stable since yesterday. She also had a low blood pressure on left arm compared to right, in the future, blood pressure should be measured in the right arm.   Seizure disorder. Continue home medicines.       Consultants: None Procedures performed: None  Disposition: Home Diet recommendation:  Discharge Diet Orders (From admission, onward)     Start     Ordered   12/04/23 0000  Diet - low sodium heart healthy        12/04/23 1014           Cardiac diet DISCHARGE MEDICATION: Allergies as of 12/04/2023   No Known Allergies      Medication List     STOP taking these medications    ciprofloxacin  500 MG tablet Commonly known as: CIPRO    folic acid  1 MG tablet Commonly known as: FOLVITE    solifenacin  10 MG tablet Commonly known as: VESICARE        TAKE these medications    cephALEXin  500 MG capsule Commonly known as: KEFLEX  Take 1 capsule (500 mg total) by mouth 4 (four) times daily for 4 days.   LamoTRIgine  300 MG Tb24 24 hour tablet Take 300 mg by mouth daily.   nicotine  21 mg/24hr patch Commonly known as: NICODERM CQ  - dosed in mg/24 hours Place 1 patch (21 mg total) onto the skin daily. Start taking on: December 05, 2023   ondansetron  4 MG disintegrating tablet Commonly known as: ZOFRAN -ODT Take 1 tablet (4 mg total) by mouth every 8 (eight) hours as needed for up to 5 days.   oxybutynin  5 MG tablet Commonly known as: DITROPAN  Take 1 tablet (  5 mg total) by mouth every 8 (eight) hours as needed for up to 9 doses for bladder spasms.   oxyCODONE  5 MG immediate release tablet Commonly known as: Roxicodone  Take 1 tablet (5 mg total) by mouth every 6 (six) hours as needed for up to 16 doses for severe pain (pain score 7-10).   polyethylene glycol 17 g packet Commonly known as: MIRALAX  / GLYCOLAX  Take 17 g by mouth 2 (two) times daily as needed.   tamsulosin  0.4 MG Caps capsule Commonly known as: FLOMAX  Take 1 capsule (0.4 mg  total) by mouth daily for 14 days.   valACYclovir 500 MG tablet Commonly known as: VALTREX Take 500 mg by mouth 2 (two) times daily as needed (outbreaks).        Follow-up Information     Fields, Glenda L, NP Follow up in 1 week(s).   Specialty: Family Medicine Why: Hospital follow up Contact information: [address] [place] [address] [phone]         [name], MD Follow up in 1 week(s).   Specialty: Urology Contact information: [address] [place] [address] [phone]                Discharge Exam: Hailey Pennington Weights   12/02/23 0345  Weight: 49.9 kg   General exam: Appears calm and comfortable  Respiratory system: Clear to auscultation. Respiratory effort normal. Cardiovascular system: S1 & S2 heard, RRR. No JVD, murmurs, rubs, gallops or clicks. No pedal edema. Gastrointestinal system: Abdomen is nondistended, soft and nontender. No organomegaly or masses felt. Normal bowel sounds heard. Central nervous system: Alert and oriented. No focal neurological deficits. Extremities: Symmetric 5 x 5 power. Skin: No rashes, lesions or ulcers Psychiatry: Judgement and insight appear normal. Mood & affect appropriate.    Condition at discharge: good  The results of significant diagnostics from this hospitalization (including imaging, microbiology, ancillary and laboratory) are listed below for reference.   Imaging Studies: CT Renal Stone Study Result Date: 12/02/2023 CLINICAL DATA:  Abdominal flank pain. Recent ureteral stone with stent placement. EXAM: CT ABDOMEN AND PELVIS WITHOUT CONTRAST TECHNIQUE: Multidetector CT imaging of the abdomen and pelvis was performed following the standard protocol without IV contrast. RADIATION DOSE REDUCTION: This exam was performed according to the departmental dose-optimization program which includes automated exposure control, adjustment of the mA and/or kV according to patient size  and/or use of iterative reconstruction technique. COMPARISON:  11/28/2023 FINDINGS: Lower chest: Unremarkable. Hepatobiliary: No suspicious focal abnormality in the liver on this study without intravenous contrast. There is no evidence for gallstones, gallbladder wall thickening, or pericholecystic fluid. No intrahepatic or extrahepatic biliary dilation. Pancreas: No focal mass lesion. No dilatation of the main duct. No intraparenchymal cyst. No peripancreatic edema. Spleen: No splenomegaly. No suspicious focal mass lesion. Adrenals/Urinary Tract: No adrenal nodule or mass. The 4 mm nonobstructing stone identified upper pole left kidney. No left hydroureteronephrosis. 8 x 8 mm right UPJ stone evident. Additional 6 x 4 mm stone is identified adjacent to the stent in the distal right ureter. Right double-J internal ureteral stent is new in the interval with proximal loop formed in the right renal pelvis and distal loop formed in the bladder lumen. Tiny gas bubbles in the right renal pelvis and urinary bladder are compatible with the recent instrumentation. Right-sided hydronephrosis seen on the previous study has essentially resolved. No right-sided perinephric fluid collection. Stomach/Bowel: Stomach is unremarkable. No gastric wall thickening. No evidence of outlet  obstruction. Duodenum is normally positioned as is the ligament of Treitz. No small bowel wall thickening. No small bowel dilatation. Moderate colonic stool volume. Vascular/Lymphatic: No abdominal aortic aneurysm. No abdominal aortic atherosclerotic calcification. There is no gastrohepatic or hepatoduodenal ligament lymphadenopathy. No retroperitoneal or mesenteric lymphadenopathy. No pelvic sidewall lymphadenopathy. Reproductive: Unremarkable. Other: No intraperitoneal free fluid. Musculoskeletal: No worrisome lytic or sclerotic osseous abnormality. IMPRESSION: 1. 8 x 8 mm right UPJ stone with a second 6 x 4 mm stone in the distal right ureter. Right  double-J internal ureteral stent is new in the interval with interval resolution of the right-sided hydronephrosis seen previously. Tiny gas bubbles in the right renal pelvis and urinary bladder are compatible with recent instrumentation. 2. 4 mm nonobstructing stone upper pole left kidney. 3. Moderate colonic stool volume. Imaging features could be compatible with constipation in the appropriate clinical setting. Electronically Signed   By: Donnal Fusi M.D.   On: 12/02/2023 05:46   DG C-Arm 1-60 Min-No Report Result Date: 11/29/2023 Fluoroscopy was utilized by the requesting physician.  No radiographic interpretation.   CT ABDOMEN PELVIS W CONTRAST Result Date: 11/28/2023 CLINICAL DATA:  RIGHT lower quadrant abdominal pain. EXAM: CT ABDOMEN AND PELVIS WITH CONTRAST TECHNIQUE: Multidetector CT imaging of the abdomen and pelvis was performed using the standard protocol following bolus administration of intravenous contrast. RADIATION DOSE REDUCTION: This exam was performed according to the departmental dose-optimization program which includes automated exposure control, adjustment of the mA and/or kV according to patient size and/or use of iterative reconstruction technique. CONTRAST:  75mL OMNIPAQUE  IOHEXOL  300 MG/ML  SOLN COMPARISON:  CT abdomen pelvis 10/29/2022 FINDINGS: Lower chest: Lung bases are clear. Hepatobiliary: No focal hepatic lesion. Normal gallbladder. No biliary duct dilatation. Common bile duct is normal. Pancreas: Pancreas is normal. No ductal dilatation. No pancreatic inflammation. Spleen: Normal spleen Adrenals/urinary tract: Adrenal glands normal. There is hydronephrosis of the RIGHT renal collecting system. There is a partially obstructing calculus at the RIGHT ureteropelvic junction measuring 10 mm (image 29/3) distal to the RIGHT ureteropelvic junction calculus the ureter remains dilated into the pelvis. There is a second obstructing calculus in the distal RIGHT ureter measuring 6 mm  on image 66/3. This distal RIGHT ureteral calculus is within 1 cm of the RIGHT vesicoureteral junction. Single nonobstructing calculus in the upper pole of the LEFT kidney. Stomach/Bowel: Stomach, small bowel, appendix, and cecum are normal. The colon and rectosigmoid colon are normal. Vascular/Lymphatic: Abdominal aorta is normal caliber. No periportal or retroperitoneal adenopathy. No pelvic adenopathy. Reproductive: Uterus and ovaries are normal. There are prominent parametrial veins along the LEFT margin of the uterus which drain into the LEFT gonadal vein. Other: No free fluid. Musculoskeletal: No aggressive osseous lesion. IMPRESSION: 1. Partially obstructing calculus at the RIGHT ureteropelvic junction. RIGHT hydronephrosis. 2. Obstructing calculus in the distal RIGHT ureter. RIGHT hydroureter. 3. Nonobstructing calculus in the upper pole of the LEFT kidney. 4. Normal appendix. 5. Prominent parametrial veins along the LEFT margin of the uterus which drain into the LEFT gonadal vein. Findings can be seen in the setting of pelvic congestion syndrome. Electronically Signed   By: Deboraha Fallow M.D.   On: 11/28/2023 16:49    Microbiology: Results for orders placed or performed during the hospital encounter of 12/02/23  Blood Culture (routine x 2)     Status: None (Preliminary result)   Collection Time: 12/02/23  4:20 AM   Specimen: BLOOD  Result Value Ref Range Status   Specimen Description BLOOD RIGHT  ARM  Final   Special Requests   Final    BOTTLES DRAWN AEROBIC AND ANAEROBIC Blood Culture results may not be optimal due to an inadequate volume of blood received in culture bottles   Culture   Final    NO GROWTH 2 DAYS Performed at [facility], [address] [place], [place] [address]    Report Status PENDING  Incomplete  Blood Culture (routine x 2)     Status: None (Preliminary result)   Collection Time: 12/02/23  4:20 AM   Specimen: BLOOD RIGHT ARM  Result Value Ref Range  Status   Specimen Description BLOOD RIGHT ARM  Final   Special Requests   Final    BOTTLES DRAWN AEROBIC AND ANAEROBIC Blood Culture results may not be optimal due to an inadequate volume of blood received in culture bottles   Culture   Final    NO GROWTH 2 DAYS Performed at [facility], [address] [place], [place] [address]    Report Status PENDING  Incomplete    Labs: CBC: Recent Labs  Lab 11/29/23 0550 11/30/23 0537 12/02/23 0350 12/03/23 0845 12/04/23 0751  WBC 4.8 8.6 6.3 4.0 3.8*  NEUTROABS  --   --  4.0  --   --   HGB 13.7 13.4 12.6 11.5* 11.0*  HCT 41.1 40.4 37.7 34.7* 33.7*  MCV 87.3 88.8 87.5 89.4 88.2  PLT 178 207 157 129* 139*   Basic Metabolic Panel: Recent Labs  Lab 11/28/23 1247 11/29/23 0550 11/30/23 0537 12/02/23 0350 12/03/23 0845 12/04/23 0404  NA 136 139 135 136  --  139  K 3.9 3.9 4.1 3.5  --  4.0  CL 105 106 106 104  --  109  CO2 21* 26 22 24   --  22  GLUCOSE 93 111* 113* 100*  --  88  BUN 16 18 18 20   --  15  CREATININE 0.59 0.77 0.77 0.85  --  0.69  CALCIUM 8.9 8.8* 8.8* 8.5*  --  8.1*  MG  --   --  2.2  --  2.0 1.9   Liver Function Tests: Recent Labs  Lab 11/28/23 1247 11/30/23 0537 12/02/23 0350  AST 15 20 20   ALT 17 19 21   ALKPHOS 46 42 41  BILITOT 1.2 0.8 0.7  PROT 7.1 7.1 6.5  ALBUMIN 4.4 4.1 4.0   CBG: No results for input(s): "GLUCAP" in the last 168 hours.  Discharge time spent: greater than 30 minutes.  Signed: Donaciano Frizzle, MD [facility] 12/04/2023

## 2023-12-04 NOTE — Plan of Care (Signed)

## 2023-12-04 NOTE — Progress Notes (Signed)
 MD order received in [facility] to discharge pt home today; verbally reviewed AVS with pt; no questions voiced at this time; pt's discharge pending arrival of her mother at [facility] for discharge; verbally instructed pt to call out on her nurse call bell and let us  know her ride is here in order to get a wheelchair for discharge

## 2023-12-04 NOTE — Progress Notes (Signed)
 Patient discharged to home. DC instructions completed by Rolande Cleverly. See nurse's note for details. Pt left unit in wheelchair pushed by NT. Left in a stable condition.

## 2023-12-06 ENCOUNTER — Encounter: Payer: Self-pay | Admitting: Urology

## 2023-12-06 ENCOUNTER — Other Ambulatory Visit: Payer: Self-pay

## 2023-12-06 DIAGNOSIS — R319 Hematuria, unspecified: Secondary | ICD-10-CM | POA: Diagnosis present

## 2023-12-06 DIAGNOSIS — N200 Calculus of kidney: Secondary | ICD-10-CM | POA: Diagnosis not present

## 2023-12-06 DIAGNOSIS — Z5321 Procedure and treatment not carried out due to patient leaving prior to being seen by health care provider: Secondary | ICD-10-CM | POA: Diagnosis not present

## 2023-12-06 LAB — URINALYSIS, ROUTINE W REFLEX MICROSCOPIC
Bilirubin Urine: NEGATIVE
Glucose, UA: NEGATIVE mg/dL
Ketones, ur: NEGATIVE mg/dL
Nitrite: NEGATIVE
Protein, ur: 100 mg/dL — AB
Specific Gravity, Urine: 1.004 — ABNORMAL LOW (ref 1.005–1.030)
pH: 7 (ref 5.0–8.0)

## 2023-12-06 LAB — CBC WITH DIFFERENTIAL/PLATELET
Abs Immature Granulocytes: 0.01 10*3/uL (ref 0.00–0.07)
Basophils Absolute: 0.1 10*3/uL (ref 0.0–0.1)
Basophils Relative: 1 %
Eosinophils Absolute: 0.4 10*3/uL (ref 0.0–0.5)
Eosinophils Relative: 6 %
HCT: 36.9 % (ref 36.0–46.0)
Hemoglobin: 12.3 g/dL (ref 12.0–15.0)
Immature Granulocytes: 0 %
Lymphocytes Relative: 25 %
Lymphs Abs: 1.5 10*3/uL (ref 0.7–4.0)
MCH: 29.4 pg (ref 26.0–34.0)
MCHC: 33.3 g/dL (ref 30.0–36.0)
MCV: 88.3 fL (ref 80.0–100.0)
Monocytes Absolute: 0.5 10*3/uL (ref 0.1–1.0)
Monocytes Relative: 9 %
Neutro Abs: 3.6 10*3/uL (ref 1.7–7.7)
Neutrophils Relative %: 59 %
Platelets: 193 10*3/uL (ref 150–400)
RBC: 4.18 MIL/uL (ref 3.87–5.11)
RDW: 12.7 % (ref 11.5–15.5)
WBC: 6 10*3/uL (ref 4.0–10.5)
nRBC: 0 % (ref 0.0–0.2)

## 2023-12-06 LAB — BASIC METABOLIC PANEL WITH GFR
Anion gap: 10 (ref 5–15)
BUN: 15 mg/dL (ref 6–20)
CO2: 25 mmol/L (ref 22–32)
Calcium: 9 mg/dL (ref 8.9–10.3)
Chloride: 105 mmol/L (ref 98–111)
Creatinine, Ser: 0.66 mg/dL (ref 0.44–1.00)
GFR, Estimated: >60 mL/min (ref >60)
Glucose, Bld: 125 mg/dL — ABNORMAL HIGH (ref 70–99)
Potassium: 3.5 mmol/L (ref 3.5–5.1)
Sodium: 140 mmol/L (ref 135–145)

## 2023-12-06 MED ORDER — CEFAZOLIN SODIUM-DEXTROSE 2-4 GM/100ML-% IV SOLN
2.0000 g | INTRAVENOUS | Status: AC
  Administered 2023-12-07: 2 g via INTRAVENOUS

## 2023-12-06 MED ORDER — ORAL CARE MOUTH RINSE: 15.0000 mL | Freq: Once | OROMUCOSAL | Status: AC

## 2023-12-06 MED ORDER — LACTATED RINGERS IV SOLN: INTRAVENOUS | Status: DC

## 2023-12-06 MED ORDER — CHLORHEXIDINE GLUCONATE 0.12 % MT SOLN
15.0000 mL | Freq: Once | OROMUCOSAL | Status: AC
Start: 2023-12-06 — End: 2023-12-07
  Administered 2023-12-07: 15 mL via OROMUCOSAL

## 2023-12-06 NOTE — ED Triage Notes (Addendum)
 Pt reports she was admitted here Sunday for renal stent placed for 2 kidney stones, pt reports hematuria x2 days. Pt states she is supposed to have surgery in the morning to have stones removed.

## 2023-12-07 ENCOUNTER — Emergency Department
Admission: EM | Admit: 2023-12-07 | Discharge: 2023-12-07 | Discharge status: Against Medical Advice | Attending: Emergency Medicine | Admitting: Emergency Medicine

## 2023-12-07 ENCOUNTER — Other Ambulatory Visit: Payer: Self-pay

## 2023-12-07 ENCOUNTER — Encounter: Payer: Self-pay | Admitting: Urology

## 2023-12-07 ENCOUNTER — Ambulatory Visit

## 2023-12-07 ENCOUNTER — Telehealth: Payer: Self-pay

## 2023-12-07 ENCOUNTER — Ambulatory Visit
Admission: RE | Admit: 2023-12-07 | Discharge: 2023-12-07 | Disposition: A | Discharge status: Home / Self Care | Attending: Urology | Admitting: Urology

## 2023-12-07 ENCOUNTER — Encounter
Admission: RE | Disposition: A | Discharge status: Home / Self Care | Payer: Self-pay | Source: Home / Self Care | Attending: Urology

## 2023-12-07 DIAGNOSIS — N202 Calculus of kidney with calculus of ureter: Secondary | ICD-10-CM

## 2023-12-07 DIAGNOSIS — F17200 Nicotine dependence, unspecified, uncomplicated: Secondary | ICD-10-CM | POA: Diagnosis not present

## 2023-12-07 DIAGNOSIS — N200 Calculus of kidney: Secondary | ICD-10-CM

## 2023-12-07 DIAGNOSIS — N132 Hydronephrosis with renal and ureteral calculous obstruction: Secondary | ICD-10-CM | POA: Insufficient documentation

## 2023-12-07 DIAGNOSIS — N201 Calculus of ureter: Secondary | ICD-10-CM | POA: Diagnosis present

## 2023-12-07 DIAGNOSIS — F17210 Nicotine dependence, cigarettes, uncomplicated: Secondary | ICD-10-CM

## 2023-12-07 DIAGNOSIS — Z79899 Other long term (current) drug therapy: Secondary | ICD-10-CM | POA: Insufficient documentation

## 2023-12-07 HISTORY — PX: CYSTOSCOPY/URETEROSCOPY/HOLMIUM LASER/STENT PLACEMENT: SHX6546

## 2023-12-07 LAB — CULTURE, BLOOD (ROUTINE X 2)
Culture: NO GROWTH
Culture: NO GROWTH

## 2023-12-07 LAB — POCT PREGNANCY, URINE: Preg Test, Ur: NEGATIVE

## 2023-12-07 SURGERY — CYSTOSCOPY/URETEROSCOPY/HOLMIUM LASER/STENT PLACEMENT
Anesthesia: General | Site: Ureter | Laterality: Right

## 2023-12-07 MED ORDER — SODIUM CHLORIDE 0.9 % IR SOLN
Status: DC | PRN
  Administered 2023-12-07: 3000 mL

## 2023-12-07 MED ORDER — LIDOCAINE HCL (CARDIAC) PF 100 MG/5ML IV SOSY
PREFILLED_SYRINGE | INTRAVENOUS | Status: DC | PRN
  Administered 2023-12-07: 60 mg via INTRAVENOUS

## 2023-12-07 MED ORDER — LIDOCAINE HCL (PF) 2 % IJ SOLN
INTRAMUSCULAR | Status: AC
  Filled 2023-12-07: qty 5

## 2023-12-07 MED ORDER — OXYCODONE HCL 5 MG PO TABS
5.0000 mg | ORAL_TABLET | Freq: Once | ORAL | Status: AC | PRN
  Administered 2023-12-07: 5 mg via ORAL

## 2023-12-07 MED ORDER — IOHEXOL 180 MG/ML  SOLN
INTRAMUSCULAR | Status: DC | PRN
  Administered 2023-12-07: 10 mL

## 2023-12-07 MED ORDER — ONDANSETRON HCL 4 MG/2ML IJ SOLN
INTRAMUSCULAR | Status: DC | PRN
  Administered 2023-12-07: 4 mg via INTRAVENOUS

## 2023-12-07 MED ORDER — CEFAZOLIN SODIUM-DEXTROSE 2-4 GM/100ML-% IV SOLN
INTRAVENOUS | Status: AC
  Filled 2023-12-07: qty 100

## 2023-12-07 MED ORDER — OXYCODONE HCL 5 MG/5ML PO SOLN: 5.0000 mg | Freq: Once | ORAL | Status: AC | PRN

## 2023-12-07 MED ORDER — MIDAZOLAM HCL 2 MG/2ML IJ SOLN
INTRAMUSCULAR | Status: DC | PRN
  Administered 2023-12-07: 2 mg via INTRAVENOUS

## 2023-12-07 MED ORDER — PROPOFOL 10 MG/ML IV BOLUS
INTRAVENOUS | Status: AC
  Filled 2023-12-07: qty 20

## 2023-12-07 MED ORDER — EPHEDRINE 5 MG/ML INJ
INTRAVENOUS | Status: AC
  Filled 2023-12-07: qty 5

## 2023-12-07 MED ORDER — PHENYLEPHRINE 80 MCG/ML (10ML) SYRINGE FOR IV PUSH (FOR BLOOD PRESSURE SUPPORT)
PREFILLED_SYRINGE | INTRAVENOUS | Status: AC
  Filled 2023-12-07: qty 10

## 2023-12-07 MED ORDER — FENTANYL CITRATE (PF) 100 MCG/2ML IJ SOLN
INTRAMUSCULAR | Status: AC
Start: 2023-12-07 — End: ?
  Filled 2023-12-07: qty 2

## 2023-12-07 MED ORDER — DEXAMETHASONE SODIUM PHOSPHATE 10 MG/ML IJ SOLN
INTRAMUSCULAR | Status: AC
Start: 2023-12-07 — End: ?
  Filled 2023-12-07: qty 1

## 2023-12-07 MED ORDER — ACETAMINOPHEN 10 MG/ML IV SOLN
INTRAVENOUS | Status: DC | PRN
  Administered 2023-12-07: 1000 mg via INTRAVENOUS

## 2023-12-07 MED ORDER — FENTANYL CITRATE (PF) 100 MCG/2ML IJ SOLN
INTRAMUSCULAR | Status: DC | PRN
  Administered 2023-12-07: 25 ug via INTRAVENOUS
  Administered 2023-12-07: 50 ug via INTRAVENOUS

## 2023-12-07 MED ORDER — OXYBUTYNIN CHLORIDE 5 MG PO TABS
ORAL_TABLET | ORAL | Status: AC
  Filled 2023-12-07: qty 1

## 2023-12-07 MED ORDER — OXYCODONE HCL 5 MG PO TABS
ORAL_TABLET | ORAL | Status: AC
  Filled 2023-12-07: qty 1

## 2023-12-07 MED ORDER — MIDAZOLAM HCL 2 MG/2ML IJ SOLN
INTRAMUSCULAR | Status: AC
  Filled 2023-12-07: qty 2

## 2023-12-07 MED ORDER — PROPOFOL 10 MG/ML IV BOLUS
INTRAVENOUS | Status: DC | PRN
  Administered 2023-12-07: 150 mg via INTRAVENOUS

## 2023-12-07 MED ORDER — STERILE WATER FOR IRRIGATION IR SOLN
Status: DC | PRN
  Administered 2023-12-07: 500 mL

## 2023-12-07 MED ORDER — CHLORHEXIDINE GLUCONATE 0.12 % MT SOLN
OROMUCOSAL | Status: AC
  Filled 2023-12-07: qty 15

## 2023-12-07 MED ORDER — FENTANYL CITRATE (PF) 100 MCG/2ML IJ SOLN: 25.0000 ug | INTRAMUSCULAR | Status: DC | PRN

## 2023-12-07 MED ORDER — OXYBUTYNIN CHLORIDE 5 MG PO TABS
5.0000 mg | ORAL_TABLET | Freq: Once | ORAL | Status: AC
  Administered 2023-12-07: 5 mg via ORAL
  Filled 2023-12-07: qty 1

## 2023-12-07 MED ORDER — DEXAMETHASONE SODIUM PHOSPHATE 10 MG/ML IJ SOLN
INTRAMUSCULAR | Status: DC | PRN
  Administered 2023-12-07: 10 mg via INTRAVENOUS

## 2023-12-07 MED ORDER — LACTATED RINGERS IV SOLN: INTRAVENOUS | Status: DC | PRN

## 2023-12-07 MED ORDER — DEXMEDETOMIDINE HCL IN NACL 80 MCG/20ML IV SOLN
INTRAVENOUS | Status: DC | PRN
  Administered 2023-12-07 (×2): 8 ug via INTRAVENOUS

## 2023-12-07 MED ORDER — PHENYLEPHRINE 80 MCG/ML (10ML) SYRINGE FOR IV PUSH (FOR BLOOD PRESSURE SUPPORT)
PREFILLED_SYRINGE | INTRAVENOUS | Status: DC | PRN
  Administered 2023-12-07: 120 ug via INTRAVENOUS

## 2023-12-07 MED ORDER — ACETAMINOPHEN 10 MG/ML IV SOLN
INTRAVENOUS | Status: AC
  Filled 2023-12-07: qty 100

## 2023-12-07 MED ORDER — EPHEDRINE SULFATE-NACL 50-0.9 MG/10ML-% IV SOSY
PREFILLED_SYRINGE | INTRAVENOUS | Status: DC | PRN
  Administered 2023-12-07 (×2): 7.5 mg via INTRAVENOUS

## 2023-12-07 MED ORDER — ONDANSETRON HCL 4 MG/2ML IJ SOLN
INTRAMUSCULAR | Status: AC
  Filled 2023-12-07: qty 2

## 2023-12-07 MED ORDER — GLYCOPYRROLATE 0.2 MG/ML IJ SOLN
INTRAMUSCULAR | Status: DC | PRN
  Administered 2023-12-07: .2 mg via INTRAVENOUS

## 2023-12-07 SURGICAL SUPPLY — 23 items
ADHESIVE MASTISOL STRL (MISCELLANEOUS) IMPLANT
BAG DRAIN SIEMENS DORNER NS (MISCELLANEOUS) ×1 IMPLANT
BASKET ZERO TIP 1.9FR (BASKET) IMPLANT
CATH URET FLEX-TIP 2 LUMEN 10F (CATHETERS) IMPLANT
CATH URETL OPEN END 6X70 (CATHETERS) IMPLANT
CNTNR URN SCR LID CUP LEK RST (MISCELLANEOUS) IMPLANT
DRAPE UTILITY 15X26 TOWEL STRL (DRAPES) ×1 IMPLANT
DRSG TEGADERM 2-3/8X2-3/4 SM (GAUZE/BANDAGES/DRESSINGS) IMPLANT
FIBER LASER MOSES 200 DFL (Laser) IMPLANT
GLOVE BIOGEL PI IND STRL 7.5 (GLOVE) ×1 IMPLANT
GOWN STRL REUS W/ TWL LRG LVL3 (GOWN DISPOSABLE) ×1 IMPLANT
GOWN STRL REUS W/ TWL XL LVL3 (GOWN DISPOSABLE) ×1 IMPLANT
GUIDEWIRE STR DUAL SENSOR (WIRE) ×1 IMPLANT
KIT TURNOVER CYSTO (KITS) ×1 IMPLANT
PACK CYSTO AR (MISCELLANEOUS) ×1 IMPLANT
SET CYSTO W/LG BORE CLAMP LF (SET/KITS/TRAYS/PACK) ×1 IMPLANT
SHEATH NAVIGATOR HD 12/14X36 (SHEATH) IMPLANT
SOL .9 NS 3000ML IRR UROMATIC (IV SOLUTION) ×1 IMPLANT
STENT URET 6FRX24 CONTOUR (STENTS) IMPLANT
STENT URET 6FRX26 CONTOUR (STENTS) IMPLANT
SURGILUBE 2OZ TUBE FLIPTOP (MISCELLANEOUS) ×1 IMPLANT
VALVE UROSEAL ADJ ENDO (VALVE) IMPLANT
WATER STERILE IRR 500ML POUR (IV SOLUTION) ×1 IMPLANT

## 2023-12-07 NOTE — Telephone Encounter (Signed)
Possible Side Effects of Stents Blood in the urine (Hematuria). This can be tea-colored, pink or bright red; you may even notice some clots. The blood may come and go while you have the stent; this is normal.  If you have bright red blood that is very thick and lasts most of the day, you may need to contact the office and discuss this with your provider.  Drinking water will help clear the blood in your urine. Pain: There can be flank, side or back pain due to the stent. It may be worse with movement. There are medicines?that can help you with pain management. Urinary urgency and frequency: You may notice you have to urinate very quickly and very often. There are medications to alleviate these symptoms; please discuss this with your provider. Burning with urination: You may experience this with a stent. Medications can help with this symptom. CALL YOUR PHYSICIAN'S OFFICE IF YOU HAVE:  Fever of 101.5 or greater Constant urine leakage after the stent has been placed  A Medical Assistant or Triage nurse is available 24 hours a day for any medical questions or needs you may have.  The triage staff can help answer questions you may have at any time of day or night. They can be reached at the Clinic's main number of [phone].

## 2023-12-07 NOTE — Discharge Instructions (Signed)
 DISCHARGE INSTRUCTIONS FOR KIDNEY STONE/URETERAL STENT   MEDICATIONS:  1. Resume all your other meds from home.  2.  AZO (over-the-counter) can help with the burning/stinging when you urinate.   ACTIVITY:  1. May resume regular activities in 24 hours. 2. No driving while on narcotic pain medications  3. Drink plenty of water   4. Continue to walk at home - you can still get blood clots when you are at home, so keep active, but don't over do it.  5. May return to work/school tomorrow or when you feel ready   BATHING:  1. You can shower. 2. You have a string coming from your urethra: The stent string is attached to your ureteral stent. Do not pull on this.   SIGNS/SYMPTOMS TO CALL:  Common postoperative symptoms include urinary frequency, urgency, bladder spasm and blood in the urine  Please call us  if you have a fever greater than 101.5, uncontrolled nausea/vomiting, uncontrolled pain, dizziness, unable to urinate, excessively bloody urine, chest pain, shortness of breath, leg swelling, leg pain, or any other concerns or questions.   You can reach us  at [phone].   FOLLOW-UP:  1. You will be contacted for a postop follow-up appointment. 2. You have a string attached to your stent, you may remove it on Thursday 12/09/2023 to do this, pull the string until the stent is completely removed. You may feel an odd sensation in your back.

## 2023-12-07 NOTE — Interval H&P Note (Signed)
 History and Physical Interval Note:  12/07/2023 11:08 AM  Hailey Pennington  has presented today for surgery, with the diagnosis of Right Ureteral Stone.  The various methods of treatment have been discussed with the patient and family. After consideration of risks, benefits and other options for treatment, the patient has consented to  Procedure(s) with comments: CYSTOSCOPY/URETEROSCOPY/HOLMIUM LASER/STENT PLACEMENT (Right) - EXCHANGE as a surgical intervention.  The patient's history has been reviewed, patient examined, no change in status, stable for surgery.  I have reviewed the patient's chart and labs.  Questions were answered to the patient's satisfaction.    38 y.o. female presents for definitive stone treatment.  Intraoperative urine culture from the renal pelvis at the time of stent placement was negative.  We discussed the need for stent replacement postop.  All questions were answered and she desires to proceed  [name]

## 2023-12-07 NOTE — Transfer of Care (Signed)
 Immediate Anesthesia Transfer of Care Note  Patient: Hailey Pennington  Procedure(s) Performed: CYSTOSCOPY/URETEROSCOPY/HOLMIUM LASER/STENT PLACEMENT (Right: Ureter)  Patient Location: PACU  Anesthesia Type:General  Level of Consciousness: sedated  Airway & Oxygen Therapy: Patient Spontanous Breathing and Patient connected to face mask oxygen  Post-op Assessment: Report given to RN and Post -op Vital signs reviewed and stable  Post vital signs: Reviewed and stable  Last Vitals:  Vitals Value Taken Time  BP 124/78 12/07/23 1251  Temp    Pulse 71 12/07/23 1254  Resp 9 12/07/23 1254  SpO2 98 % 12/07/23 1254  Vitals shown include unfiled device data.  Last Pain:  Vitals:   12/07/23 0907  TempSrc: Tympanic  PainSc: 5          Complications: There were no known notable events for this encounter.

## 2023-12-07 NOTE — Op Note (Signed)
 Preoperative diagnosis:  Right ureteral calculus Right nephrolithiasis  Postoperative diagnosis:  Same  Procedure:  Cystoscopy Right ureteroscopy and stone removal x 2 Ureteroscopic laser lithotripsy Right ureteral stent exchange (16F/24 cm) Right retrograde pyelography with interpretation  Surgeon: Geralyn Knee C. Zenna Traister, M.D.  Anesthesia: General  Complications: None  Intraoperative findings:  Cystoscopy: Inflammatory changes right UO secondary to indwelling stent.  No solid or papillary lesions Ureteroscopy: Non-impacted right distal ureteral calculus, Non-Impacted right UPJ calculus Right retrograde pyelography post   procedure showed no filling defects, stone fragments or contrast extravasation  EBL: Minimal  Specimens: Calculus fragments for analysis   Indication: Annsley Pennington is a 38 y.o. female with status post right ureteral stent placement 11/29/2023 for obstructing right distal and UPJ calculi with possible infection.  Intraoperative urine culture was negative.  She has had significant stent symptoms requiring readmission and presents today for definitive stone management.  After reviewing the management options for treatment, the patient elected to proceed with the above surgical procedure(s). We have discussed the potential benefits and risks of the procedure, side effects of the proposed treatment, the likelihood of the patient achieving the goals of the procedure, and any potential problems that might occur during the procedure or recuperation. Informed consent has been obtained.  Description of procedure:  The patient was taken to the operating room and general anesthesia was induced.  The patient was placed in the dorsal lithotomy position, prepped and draped in the usual sterile fashion, and preoperative antibiotics were administered. A preoperative time-out was performed.   A 21 French cystoscope was lubricated and placed per urethra.  Panendoscopy was  performed with findings as described above  The right ureteral stent was grasped with endoscopic forceps and brought out through the urethral meatus.  A 0.038 Sensor wire was then placed through the stent and advanced into the renal pelvis proximal to the UPJ stone which was easily visualized on fluoroscopy.  A 4.5 Fr semirigid ureteroscope was then advanced into the ureter next to the guidewire and the distal calculus was easily identified identified.  The stone was then fragmented with a 200 m Moses holmium laser fiber at a setting of 0.3 J/40 hz.  All fragments were then removed from the ureter with a zero tip nitinol basket.  Reinspection of the ureter revealed no remaining visible stones or fragments.   The semirigid ureteroscope was removed and a single channel digital flexible ureteroscope was placed per urethra.  It was easily advanced into the ureter alongside the guidewire.  The distal ureter was dilated and easily advanced to the UPJ where the proximal stone was identified. The proximal calculus was dusted at a setting of 0.3J/80 Hz with the Moses fiber.  Once dusted to a smaller size fragments migrated to mid and lower pole calyces where they were further dusted with noncontact laser lithotripsy to fragments less than the tip of the laser fiber.  A few larger fragments were placed in the nitinol basket, removed and sent for stone analysis.  Once the fragments were felt to be adequately dusted retrograde pyelogram was performed through the ureteroscope with findings as described above.  All calyces were examined under fluoroscopic guidance and no significantly sized fragments were identified.  The ureteroscope was removed under direct vision and no significantly sized fragments were noted in the ureter.  A 6 F/24 cm Contour ureteral stent was placed under fluoroscopic guidance.  The wire was then removed with an adequate stent curl noted in an upper  pole calyx as well as in the  bladder.  The bladder was then emptied and the procedure ended.  The patient appeared to tolerate the procedure well and without complications.  After anesthetic reversal the patient was transported to the PACU in stable condition.   Plan: Due to difficulty tolerating her stent, adequate dusting and a widely patent ureter her stent was left to tether and she was instructed to remove 12/09/23 Postop follow-up ~ 1 month   Hailey Elam, MD

## 2023-12-07 NOTE — Telephone Encounter (Signed)
 Patient says oxybutynin  does not help with the spasms.  She is going to try the vesicare .  Education sent to patient.  Advised her to call if she has any other questions.

## 2023-12-08 ENCOUNTER — Encounter: Payer: Self-pay | Admitting: Urology

## 2023-12-08 NOTE — Anesthesia Postprocedure Evaluation (Signed)
 Anesthesia Post Note  Patient: Hailey Pennington  Procedure(s) Performed: CYSTOSCOPY/URETEROSCOPY/HOLMIUM LASER/STENT PLACEMENT (Right: Ureter)  Patient location during evaluation: PACU Anesthesia Type: General Level of consciousness: awake and alert Pain management: pain level controlled Vital Signs Assessment: post-procedure vital signs reviewed and stable Respiratory status: spontaneous breathing, nonlabored ventilation, respiratory function stable and patient connected to nasal cannula oxygen Cardiovascular status: blood pressure returned to baseline and stable Postop Assessment: no apparent nausea or vomiting Anesthetic complications: no  There were no known notable events for this encounter.   Last Vitals:  Vitals:   12/07/23 1345 12/07/23 1353  BP: (!) 142/74 (!) 143/84  Pulse: (!) 56 (!) 54  Resp: (!) 9 16  Temp: (!) 36.3 C (!) 36.1 C  SpO2: 96% 99%    Last Pain:  Vitals:   12/07/23 1353  TempSrc: Temporal  PainSc: 3                  Enrique Harvest

## 2023-12-08 NOTE — Anesthesia Preprocedure Evaluation (Addendum)
 Anesthesia Evaluation  Patient identified by MRN, date of birth, ID band Patient awake    Reviewed: Allergy & Precautions, NPO status , Patient's Chart, lab work & pertinent test results  Airway Mallampati: III  TM Distance: >3 FB Neck ROM: full    Dental  (+) Chipped   Pulmonary neg pulmonary ROS, Current Smoker and Patient abstained from smoking.   Pulmonary exam normal        Cardiovascular negative cardio ROS Normal cardiovascular exam     Neuro/Psych negative neurological ROS  negative psych ROS   GI/Hepatic negative GI ROS, Neg liver ROS,,,  Endo/Other  negative endocrine ROS    Renal/GU      Musculoskeletal   Abdominal   Peds  Hematology negative hematology ROS (+)   Anesthesia Other Findings Past Medical History: No date: Herpes No date: History of HPV infection No date: Seizures (HCC) No date: Syringomyelia (HCC)  Past Surgical History: No date: CESAREAN SECTION 11/29/2023: CYSTOSCOPY W/ URETERAL STENT PLACEMENT; Right     Comment:  Procedure: CYSTOSCOPY, WITH RETROGRADE PYELOGRAM AND               URETERAL STENT INSERTION;  Surgeon: [name], MD;              Location: [facility];  Service: Urology;  Laterality:               Right; 12/07/2023: CYSTOSCOPY/URETEROSCOPY/HOLMIUM LASER/STENT PLACEMENT;  Right     Comment:  Procedure: CYSTOSCOPY/URETEROSCOPY/HOLMIUM LASER/STENT               PLACEMENT;  Surgeon: [name], MD;  Location:               [facility];  Service: Urology;  Laterality: Right;                EXCHANGE No date: WISDOM TOOTH EXTRACTION  BMI    Body Mass Index: 20.78 kg/m      Reproductive/Obstetrics negative OB ROS                             Anesthesia Physical Anesthesia Plan  ASA: 2  Anesthesia Plan: General   Post-op Pain Management: Minimal or no pain anticipated   Induction: Intravenous  PONV Risk Score and Plan: 3 and  Propofol  infusion, TIVA and Ondansetron   Airway Management Planned: Oral ETT  Additional Equipment: None  Intra-op Plan:   Post-operative Plan:   Informed Consent: I have reviewed the patients History and Physical, chart, labs and discussed the procedure including the risks, benefits and alternatives for the proposed anesthesia with the patient or authorized representative who has indicated his/her understanding and acceptance.     Dental advisory given  Plan Discussed with: CRNA and Surgeon  Anesthesia Plan Comments: (Discussed risks of anesthesia with patient, including possibility of difficulty with spontaneous ventilation under anesthesia necessitating airway intervention, PONV, and rare risks such as cardiac or respiratory or neurological events, and allergic reactions. Discussed the role of CRNA in patient's perioperative care. Patient understands.)        Anesthesia Quick Evaluation

## 2023-12-09 ENCOUNTER — Other Ambulatory Visit: Payer: Self-pay | Admitting: Urology

## 2023-12-09 ENCOUNTER — Telehealth: Payer: Self-pay

## 2023-12-09 MED ORDER — HYDROXYZINE HCL 10 MG PO TABS: 10.0000 mg | ORAL_TABLET | Freq: Three times a day (TID) | ORAL | 0 refills | Status: DC | PRN

## 2023-12-09 NOTE — Telephone Encounter (Signed)
 Pt calls triage line and requests return to work/school note. Note sent via mychart.  Pt also questions how to remove ureteral stent on dangler, advised pt to pull stent in one fluid motion similar to the removal of a tampon. Pt voiced understanding.

## 2023-12-09 NOTE — Telephone Encounter (Signed)
 Notified patient as instructed,  patient agrees. Letter sent .

## 2023-12-14 LAB — CALCULI, WITH PHOTOGRAPH (CLINICAL LAB)
Calcium Oxalate Dihydrate: 30 %
Calcium Oxalate Monohydrate: 50 %
Hydroxyapatite: 20 %
Weight Calculi: 6 mg

## 2023-12-16 ENCOUNTER — Other Ambulatory Visit: Payer: Self-pay | Admitting: Urology

## 2024-01-12 ENCOUNTER — Ambulatory Visit: Admitting: Urology

## 2024-01-14 ENCOUNTER — Encounter: Admitting: Urology

## 2024-02-11 ENCOUNTER — Ambulatory Visit (INDEPENDENT_AMBULATORY_CARE_PROVIDER_SITE_OTHER): Admitting: Urology

## 2024-02-11 VITALS — BP 110/68 | HR 73 | Ht 61.0 in | Wt 109.5 lb

## 2024-02-11 DIAGNOSIS — N390 Urinary tract infection, site not specified: Secondary | ICD-10-CM | POA: Diagnosis not present

## 2024-02-11 DIAGNOSIS — N200 Calculus of kidney: Secondary | ICD-10-CM

## 2024-02-11 LAB — URINALYSIS, COMPLETE
Bilirubin, UA: NEGATIVE
Glucose, UA: NEGATIVE
Ketones, UA: NEGATIVE
Leukocytes,UA: NEGATIVE
Nitrite, UA: NEGATIVE
Protein,UA: NEGATIVE
Specific Gravity, UA: 1.02 (ref 1.005–1.030)
Urobilinogen, Ur: 0.2 mg/dL (ref 0.2–1.0)
pH, UA: 6 (ref 5.0–7.5)

## 2024-02-11 LAB — MICROSCOPIC EXAMINATION

## 2024-02-11 LAB — BLADDER SCAN AMB NON-IMAGING: Scan Result: 14

## 2024-02-11 NOTE — Progress Notes (Unsigned)
 02/11/2024 12:19 PM   Hailey Pennington May 10, 1986 [id number]  Referring provider: Harvey Gaetana CROME, NP [address] [place],  [place] [address]  Chief Complaint  Patient presents with   Recurrent UTI    HPI: Hailey Pennington is a 38 y.o. female presents for a 3 month follow-up visit.  Initially seen 11/05/2023 for recurrent UTI.  Postcoital Macrodantin  prophylaxis was recommended after she completed treatment of her UTI Subsequently presented to the ED April 2025 with right lower quadrant abdominal pain and found to have a 10 mm right UPJ stone and 6 mm right distal ureteral stone with possible infection. Stent placed 11/29/2023; urine culture from right renal pelvis negative Right ureteroscopic stone removal 12/07/2023 Stone analysis: CaOxMono/CaOxDi/CaPhos 50/30/20 She has significantly increased her water  intake and has no complaints No recurrent UTIs in not utilizing postcoital prophylaxis CT with 2, small nonobstructing left renal calculi   PMH: Past Medical History:  Diagnosis Date   Herpes    History of HPV infection    Seizures (HCC)    Syringomyelia (HCC)     Surgical History: Past Surgical History:  Procedure Laterality Date   CESAREAN SECTION     CYSTOSCOPY W/ URETERAL STENT PLACEMENT Right 11/29/2023   Procedure: CYSTOSCOPY, WITH RETROGRADE PYELOGRAM AND URETERAL STENT INSERTION;  Surgeon: Twylla Glendia BROCKS, MD;  Location: [facility];  Service: Urology;  Laterality: Right;   CYSTOSCOPY/URETEROSCOPY/HOLMIUM LASER/STENT PLACEMENT Right 12/07/2023   Procedure: CYSTOSCOPY/URETEROSCOPY/HOLMIUM LASER/STENT PLACEMENT;  Surgeon: Twylla Glendia BROCKS, MD;  Location: [facility];  Service: Urology;  Laterality: Right;  EXCHANGE   WISDOM TOOTH EXTRACTION      Home Medications:  Allergies as of 02/11/2024   No Known Allergies      Medication List        Accurate as of February 11, 2024 12:19 PM. If you have any questions, ask your nurse or doctor.          STOP  taking these medications    hydrOXYzine  10 MG tablet Commonly known as: ATARAX    nicotine  21 mg/24hr patch Commonly known as: NICODERM CQ  - dosed in mg/24 hours   oxybutynin  5 MG tablet Commonly known as: DITROPAN    oxyCODONE  5 MG immediate release tablet Commonly known as: Roxicodone    polyethylene glycol 17 g packet Commonly known as: MIRALAX  / GLYCOLAX        TAKE these medications    LamoTRIgine  300 MG Tb24 24 hour tablet Take 300 mg by mouth daily.   valACYclovir 500 MG tablet Commonly known as: VALTREX Take 500 mg by mouth 2 (two) times daily as needed (outbreaks).        Allergies: No Known Allergies  Family History: Family History  Problem Relation Age of Onset   Other Mother        precancerous cells in uterine lining   Endometriosis Mother    Testicular cancer Maternal Uncle    Endometriosis Maternal Grandmother    Uterine cancer Maternal Grandmother        late years   Pancreatic cancer Maternal Grandfather        46   Colon cancer Paternal Grandfather    Cancer Paternal Grandfather        eye    Social History:  reports that she has been smoking. She has never used smokeless tobacco. She reports current alcohol use. She reports that she does not use drugs.   Physical Exam: BP 110/68   Pulse 73   Ht 5' 1 (1.549 m)  Wt 109 lb 8 oz (49.7 kg)   BMI 20.69 kg/m   Constitutional:  Alert and oriented, No acute distress. HEENT: Bright AT Respiratory: Normal respiratory effort, no increased work of breathing. Psychiatric: Normal mood and affect.  Laboratory  Urinalysis Dipstick trace blood/microscopy 3-10 RBC Assessment & Plan:    1.  Nephrolithiasis General stone prevention guidelines discussed 1 year follow-up with KUB   2.  Recurrent UTI Has been asymptomatic and not required postcoital prophylaxis Repeat PVR today 14 mL   Glendia JAYSON Barba, MD  [facility] [address], [address] [place], [place]  [address] [phone]

## 2024-02-16 ENCOUNTER — Encounter: Payer: Self-pay | Admitting: Urology

## 2024-06-06 ENCOUNTER — Encounter

## 2024-06-14 ENCOUNTER — Other Ambulatory Visit

## 2024-06-14 DIAGNOSIS — Z23 Encounter for immunization: Secondary | ICD-10-CM | POA: Diagnosis not present

## 2024-09-06 NOTE — Progress Notes (Unsigned)
 "  Referring Physician:  Harvey Gaetana CROME, NP [address] [place],  [place] [address]  Primary Physician:  Hailey Gaetana CROME, NP  History of Present Illness: 09/06/2024*** [name] has a history of ***  Neck pain?  Duration: *** Location: *** Quality: *** Severity: ***  Precipitating: aggravated by *** Modifying factors: made better by *** Weakness: none Timing: ***  Tobacco use: smokes *** PPD x years. Does not smoke.   Bowel/Bladder Dysfunction: none  Conservative measures:  Physical therapy: *** has not participated for her neck since 2023? Multimodal medical therapy including regular antiinflammatories: *** ?? Injections: *** 01/19/2022--s/p bilateral T7-8 TFESI with no relief   Past Surgery: ***no spine surgeries  Hailey Pennington has ***no symptoms of cervical myelopathy.  The symptoms are causing a significant impact on the patient's life.   Review of Systems:  A 10 point review of systems is negative, except for the pertinent positives and negatives detailed in the HPI.  Past Medical History: Past Medical History:  Diagnosis Date   Herpes    History of HPV infection    Seizures (HCC)    Syringomyelia (HCC)     Past Surgical History: Past Surgical History:  Procedure Laterality Date   CESAREAN SECTION     CYSTOSCOPY W/ URETERAL STENT PLACEMENT Right 11/29/2023   Procedure: CYSTOSCOPY, WITH RETROGRADE PYELOGRAM AND URETERAL STENT INSERTION;  Surgeon: Hailey Glendia BROCKS, MD;  Location: [facility];  Service: Urology;  Laterality: Right;   CYSTOSCOPY/URETEROSCOPY/HOLMIUM LASER/STENT PLACEMENT Right 12/07/2023   Procedure: CYSTOSCOPY/URETEROSCOPY/HOLMIUM LASER/STENT PLACEMENT;  Surgeon: Hailey Glendia BROCKS, MD;  Location: [facility];  Service: Urology;  Laterality: Right;  EXCHANGE   WISDOM TOOTH EXTRACTION      Allergies: Allergies as of 09/11/2024   (No Known Allergies)    Medications: Outpatient Encounter Medications as of 09/11/2024   Medication Sig   LamoTRIgine  300 MG TB24 24 hour tablet Take 300 mg by mouth daily.   valACYclovir (VALTREX) 500 MG tablet Take 500 mg by mouth 2 (two) times daily as needed (outbreaks).   No facility-administered encounter medications on file as of 09/11/2024.    Social History: Social History[1]  Family Medical History: Family History  Problem Relation Age of Onset   Other Mother        precancerous cells in uterine lining   Endometriosis Mother    Testicular cancer Maternal Uncle    Endometriosis Maternal Grandmother    Uterine cancer Maternal Grandmother        late years   Pancreatic cancer Maternal Grandfather        69   Colon cancer Paternal Grandfather    Cancer Paternal Grandfather        eye    Physical Examination: There were no vitals filed for this visit.  General: Patient is well developed, well nourished, calm, collected, and in no apparent distress. Attention to examination is appropriate.  Respiratory: Patient is breathing without any difficulty.   NEUROLOGICAL:     Awake, alert, oriented to person, place, and time.  Speech is clear and fluent. Fund of knowledge is appropriate.   Cranial Nerves: Pupils equal round and reactive to light.  Facial tone is symmetric.    *** ROM of cervical spine *** pain *** posterior cervical tenderness. *** tenderness in bilateral trapezial region.   *** ROM of lumbar spine *** pain *** posterior lumbar tenderness.   No abnormal lesions on exposed skin.   Strength: Side Biceps Triceps Deltoid Interossei Grip Wrist  Ext. Wrist Flex.  R 5 5 5 5 5 5 5   L 5 5 5 5 5 5 5    Side Iliopsoas Quads Hamstring PF DF EHL  R 5 5 5 5 5 5   L 5 5 5 5 5 5    Reflexes are ***2+ and symmetric at the biceps, brachioradialis, patella and achilles.   Hoffman's is absent.  Clonus is not present.   Bilateral upper and lower extremity sensation is intact to light touch.     Gait is normal.   ***No difficulty with tandem gait.     Medical Decision Making  Imaging: ***  I have personally reviewed the images and agree with the above interpretation.  Assessment and Plan: Ms. Lecomte is a pleasant 39 y.o. female has ***  Treatment options discussed with patient and following plan made:   - Order for physical therapy for *** spine ***. Patient to call to schedule appointment. *** - Continue current medications including ***. Reviewed dosing and side effects.  - Prescription for ***. Reviewed dosing and side effects. Take with food.  - Prescription for *** to take prn muscle spasms. Reviewed dosing and side effects. Discussed this can cause drowsiness.  - MRI of *** to further evaluate *** radiculopathy. No improvement time or medications (***).  - Referral to PMR at [facility] to discuss possible *** injections.  - Will schedule phone visit to review MRI results once I get them back.   I spent a total of *** minutes in face-to-face and non-face-to-face activities related to this patient's care today including review of outside records, review of imaging, review of symptoms, physical exam, discussion of differential diagnosis, discussion of treatment options, and documentation.   Thank you for involving me in the care of this patient.   Hailey Boys PA-C Dept. of Neurosurgery     [1]  Social History Tobacco Use   Smoking status: Every Day   Smokeless tobacco: Never  Vaping Use   Vaping status: Never Used  Substance Use Topics   Alcohol use: Yes   Drug use: No   "

## 2024-09-11 ENCOUNTER — Ambulatory Visit: Admitting: Orthopedic Surgery

## 2024-09-12 ENCOUNTER — Other Ambulatory Visit: Payer: Self-pay

## 2024-09-12 ENCOUNTER — Inpatient Hospital Stay
Admission: RE | Admit: 2024-09-12 | Discharge: 2024-09-12 | Disposition: A | Payer: Self-pay | Source: Ambulatory Visit | Attending: Orthopedic Surgery | Admitting: Orthopedic Surgery

## 2024-09-12 DIAGNOSIS — Z049 Encounter for examination and observation for unspecified reason: Secondary | ICD-10-CM

## 2024-10-16 ENCOUNTER — Ambulatory Visit: Admitting: Orthopedic Surgery

## 2025-02-06 ENCOUNTER — Ambulatory Visit: Admitting: Urology
